# Patient Record
Sex: Female | Born: 1945 | Race: White | Hispanic: No | Smoking: Former smoker
Health system: Southern US, Community
[De-identification: ages and names within clinical notes are randomized; demographics above are authoritative.]

## PROBLEM LIST (undated history)

## (undated) DIAGNOSIS — F32A Depression, unspecified: Secondary | ICD-10-CM

## (undated) DIAGNOSIS — C801 Malignant (primary) neoplasm, unspecified: Secondary | ICD-10-CM

## (undated) DIAGNOSIS — E119 Type 2 diabetes mellitus without complications: Secondary | ICD-10-CM

## (undated) DIAGNOSIS — Z9289 Personal history of other medical treatment: Secondary | ICD-10-CM

## (undated) DIAGNOSIS — K219 Gastro-esophageal reflux disease without esophagitis: Secondary | ICD-10-CM

## (undated) DIAGNOSIS — K589 Irritable bowel syndrome without diarrhea: Secondary | ICD-10-CM

## (undated) DIAGNOSIS — F329 Major depressive disorder, single episode, unspecified: Secondary | ICD-10-CM

## (undated) DIAGNOSIS — C73 Malignant neoplasm of thyroid gland: Secondary | ICD-10-CM

## (undated) HISTORY — DX: Personal history of other medical treatment: Z92.89

## (undated) HISTORY — DX: Malignant (primary) neoplasm, unspecified: C80.1

## (undated) HISTORY — DX: Major depressive disorder, single episode, unspecified: F32.9

## (undated) HISTORY — DX: Depression, unspecified: F32.A

## (undated) HISTORY — DX: Type 2 diabetes mellitus without complications: E11.9

## (undated) HISTORY — PX: THYROIDECTOMY: SHX17

## (undated) HISTORY — PX: APPENDECTOMY: SHX54

## (undated) HISTORY — PX: CAROTID ENDARTERECTOMY: SUR193

## (undated) HISTORY — DX: Irritable bowel syndrome, unspecified: K58.9

## (undated) HISTORY — DX: Gastro-esophageal reflux disease without esophagitis: K21.9

## (undated) HISTORY — PX: CAROTID STENT: SHX1301

## (undated) HISTORY — DX: Irritable bowel syndrome without diarrhea: K58.9

## (undated) HISTORY — PX: TOTAL ABDOMINAL HYSTERECTOMY W/ BILATERAL SALPINGOOPHORECTOMY: SHX83

## (undated) HISTORY — PX: RENAL ARTERY STENT: SHX2321

## (undated) HISTORY — PX: TONSILLECTOMY AND ADENOIDECTOMY: SUR1326

## (undated) HISTORY — PX: BLADDER SUSPENSION: SHX72

---

## 2008-12-19 HISTORY — PX: ESOPHAGOGASTRODUODENOSCOPY: SHX1529

## 2011-09-16 DIAGNOSIS — I6529 Occlusion and stenosis of unspecified carotid artery: Secondary | ICD-10-CM | POA: Insufficient documentation

## 2013-08-09 DIAGNOSIS — R1011 Right upper quadrant pain: Secondary | ICD-10-CM | POA: Diagnosis not present

## 2013-08-09 DIAGNOSIS — E669 Obesity, unspecified: Secondary | ICD-10-CM | POA: Diagnosis not present

## 2013-08-09 DIAGNOSIS — M546 Pain in thoracic spine: Secondary | ICD-10-CM | POA: Diagnosis not present

## 2013-08-23 DIAGNOSIS — I739 Peripheral vascular disease, unspecified: Secondary | ICD-10-CM | POA: Diagnosis not present

## 2013-08-23 DIAGNOSIS — J983 Compensatory emphysema: Secondary | ICD-10-CM | POA: Diagnosis not present

## 2013-08-23 DIAGNOSIS — Z7901 Long term (current) use of anticoagulants: Secondary | ICD-10-CM | POA: Diagnosis not present

## 2013-08-23 DIAGNOSIS — I4891 Unspecified atrial fibrillation: Secondary | ICD-10-CM | POA: Diagnosis not present

## 2013-08-23 DIAGNOSIS — E119 Type 2 diabetes mellitus without complications: Secondary | ICD-10-CM | POA: Diagnosis not present

## 2013-08-23 DIAGNOSIS — R1011 Right upper quadrant pain: Secondary | ICD-10-CM | POA: Diagnosis not present

## 2013-09-05 DIAGNOSIS — R109 Unspecified abdominal pain: Secondary | ICD-10-CM | POA: Diagnosis not present

## 2013-09-05 DIAGNOSIS — N289 Disorder of kidney and ureter, unspecified: Secondary | ICD-10-CM | POA: Diagnosis not present

## 2013-11-30 DIAGNOSIS — I6529 Occlusion and stenosis of unspecified carotid artery: Secondary | ICD-10-CM | POA: Diagnosis not present

## 2013-11-30 DIAGNOSIS — E119 Type 2 diabetes mellitus without complications: Secondary | ICD-10-CM | POA: Diagnosis not present

## 2013-11-30 DIAGNOSIS — J983 Compensatory emphysema: Secondary | ICD-10-CM | POA: Diagnosis not present

## 2013-11-30 DIAGNOSIS — E785 Hyperlipidemia, unspecified: Secondary | ICD-10-CM | POA: Diagnosis not present

## 2013-11-30 DIAGNOSIS — I10 Essential (primary) hypertension: Secondary | ICD-10-CM | POA: Diagnosis not present

## 2013-11-30 DIAGNOSIS — Z7901 Long term (current) use of anticoagulants: Secondary | ICD-10-CM | POA: Diagnosis not present

## 2013-11-30 DIAGNOSIS — I4891 Unspecified atrial fibrillation: Secondary | ICD-10-CM | POA: Diagnosis not present

## 2013-11-30 DIAGNOSIS — I739 Peripheral vascular disease, unspecified: Secondary | ICD-10-CM | POA: Diagnosis not present

## 2013-12-19 DIAGNOSIS — L219 Seborrheic dermatitis, unspecified: Secondary | ICD-10-CM | POA: Diagnosis not present

## 2013-12-19 DIAGNOSIS — L280 Lichen simplex chronicus: Secondary | ICD-10-CM | POA: Diagnosis not present

## 2013-12-19 DIAGNOSIS — L820 Inflamed seborrheic keratosis: Secondary | ICD-10-CM | POA: Diagnosis not present

## 2013-12-23 DIAGNOSIS — F17200 Nicotine dependence, unspecified, uncomplicated: Secondary | ICD-10-CM | POA: Diagnosis not present

## 2013-12-23 DIAGNOSIS — M4120 Other idiopathic scoliosis, site unspecified: Secondary | ICD-10-CM | POA: Diagnosis not present

## 2013-12-23 DIAGNOSIS — E7800 Pure hypercholesterolemia, unspecified: Secondary | ICD-10-CM | POA: Diagnosis not present

## 2013-12-23 DIAGNOSIS — E119 Type 2 diabetes mellitus without complications: Secondary | ICD-10-CM | POA: Diagnosis not present

## 2013-12-23 DIAGNOSIS — I4891 Unspecified atrial fibrillation: Secondary | ICD-10-CM | POA: Diagnosis not present

## 2013-12-23 DIAGNOSIS — I10 Essential (primary) hypertension: Secondary | ICD-10-CM | POA: Diagnosis not present

## 2013-12-23 DIAGNOSIS — M47814 Spondylosis without myelopathy or radiculopathy, thoracic region: Secondary | ICD-10-CM | POA: Diagnosis not present

## 2013-12-23 DIAGNOSIS — I509 Heart failure, unspecified: Secondary | ICD-10-CM | POA: Diagnosis not present

## 2013-12-23 DIAGNOSIS — Z7901 Long term (current) use of anticoagulants: Secondary | ICD-10-CM | POA: Diagnosis not present

## 2013-12-23 DIAGNOSIS — K219 Gastro-esophageal reflux disease without esophagitis: Secondary | ICD-10-CM | POA: Diagnosis not present

## 2013-12-23 DIAGNOSIS — J449 Chronic obstructive pulmonary disease, unspecified: Secondary | ICD-10-CM | POA: Diagnosis not present

## 2013-12-23 DIAGNOSIS — M546 Pain in thoracic spine: Secondary | ICD-10-CM | POA: Diagnosis not present

## 2013-12-23 DIAGNOSIS — R0602 Shortness of breath: Secondary | ICD-10-CM | POA: Diagnosis not present

## 2013-12-23 DIAGNOSIS — Z79899 Other long term (current) drug therapy: Secondary | ICD-10-CM | POA: Diagnosis not present

## 2014-01-05 DIAGNOSIS — R002 Palpitations: Secondary | ICD-10-CM | POA: Diagnosis not present

## 2014-01-05 DIAGNOSIS — F17200 Nicotine dependence, unspecified, uncomplicated: Secondary | ICD-10-CM | POA: Diagnosis not present

## 2014-01-05 DIAGNOSIS — Z794 Long term (current) use of insulin: Secondary | ICD-10-CM | POA: Diagnosis not present

## 2014-01-05 DIAGNOSIS — K219 Gastro-esophageal reflux disease without esophagitis: Secondary | ICD-10-CM | POA: Diagnosis not present

## 2014-01-05 DIAGNOSIS — I4891 Unspecified atrial fibrillation: Secondary | ICD-10-CM | POA: Diagnosis not present

## 2014-01-05 DIAGNOSIS — I10 Essential (primary) hypertension: Secondary | ICD-10-CM | POA: Diagnosis not present

## 2014-01-05 DIAGNOSIS — Z7901 Long term (current) use of anticoagulants: Secondary | ICD-10-CM | POA: Diagnosis not present

## 2014-01-05 DIAGNOSIS — E7800 Pure hypercholesterolemia, unspecified: Secondary | ICD-10-CM | POA: Diagnosis not present

## 2014-01-05 DIAGNOSIS — Z79899 Other long term (current) drug therapy: Secondary | ICD-10-CM | POA: Diagnosis not present

## 2014-01-05 DIAGNOSIS — J449 Chronic obstructive pulmonary disease, unspecified: Secondary | ICD-10-CM | POA: Diagnosis not present

## 2014-01-05 DIAGNOSIS — E119 Type 2 diabetes mellitus without complications: Secondary | ICD-10-CM | POA: Diagnosis not present

## 2014-01-05 DIAGNOSIS — R918 Other nonspecific abnormal finding of lung field: Secondary | ICD-10-CM | POA: Diagnosis not present

## 2014-01-05 DIAGNOSIS — I509 Heart failure, unspecified: Secondary | ICD-10-CM | POA: Diagnosis not present

## 2014-01-05 DIAGNOSIS — R079 Chest pain, unspecified: Secondary | ICD-10-CM | POA: Diagnosis not present

## 2014-03-17 DIAGNOSIS — K620 Anal polyp: Secondary | ICD-10-CM | POA: Diagnosis not present

## 2014-03-17 DIAGNOSIS — I6529 Occlusion and stenosis of unspecified carotid artery: Secondary | ICD-10-CM | POA: Diagnosis not present

## 2014-03-17 DIAGNOSIS — I10 Essential (primary) hypertension: Secondary | ICD-10-CM | POA: Diagnosis not present

## 2014-03-17 DIAGNOSIS — I4891 Unspecified atrial fibrillation: Secondary | ICD-10-CM | POA: Diagnosis not present

## 2014-03-17 DIAGNOSIS — R1310 Dysphagia, unspecified: Secondary | ICD-10-CM | POA: Diagnosis not present

## 2014-03-17 DIAGNOSIS — Z794 Long term (current) use of insulin: Secondary | ICD-10-CM | POA: Diagnosis not present

## 2014-03-17 DIAGNOSIS — D126 Benign neoplasm of colon, unspecified: Secondary | ICD-10-CM | POA: Diagnosis not present

## 2014-03-17 DIAGNOSIS — I739 Peripheral vascular disease, unspecified: Secondary | ICD-10-CM | POA: Diagnosis not present

## 2014-03-17 DIAGNOSIS — Z886 Allergy status to analgesic agent status: Secondary | ICD-10-CM | POA: Diagnosis not present

## 2014-03-17 DIAGNOSIS — E785 Hyperlipidemia, unspecified: Secondary | ICD-10-CM | POA: Diagnosis not present

## 2014-03-17 DIAGNOSIS — Z79899 Other long term (current) drug therapy: Secondary | ICD-10-CM | POA: Diagnosis not present

## 2014-03-17 DIAGNOSIS — Z7901 Long term (current) use of anticoagulants: Secondary | ICD-10-CM | POA: Diagnosis not present

## 2014-03-17 DIAGNOSIS — K648 Other hemorrhoids: Secondary | ICD-10-CM | POA: Diagnosis not present

## 2014-03-17 DIAGNOSIS — R1011 Right upper quadrant pain: Secondary | ICD-10-CM | POA: Diagnosis not present

## 2014-03-17 DIAGNOSIS — Z86010 Personal history of colonic polyps: Secondary | ICD-10-CM | POA: Diagnosis not present

## 2014-03-17 DIAGNOSIS — Z87891 Personal history of nicotine dependence: Secondary | ICD-10-CM | POA: Diagnosis not present

## 2014-03-17 DIAGNOSIS — Z91040 Latex allergy status: Secondary | ICD-10-CM | POA: Diagnosis not present

## 2014-03-17 DIAGNOSIS — E7800 Pure hypercholesterolemia, unspecified: Secondary | ICD-10-CM | POA: Diagnosis not present

## 2014-03-17 DIAGNOSIS — K219 Gastro-esophageal reflux disease without esophagitis: Secondary | ICD-10-CM | POA: Diagnosis not present

## 2014-03-17 DIAGNOSIS — K209 Esophagitis, unspecified without bleeding: Secondary | ICD-10-CM | POA: Diagnosis not present

## 2014-03-17 DIAGNOSIS — Z6836 Body mass index (BMI) 36.0-36.9, adult: Secondary | ICD-10-CM | POA: Diagnosis not present

## 2014-03-17 DIAGNOSIS — K621 Rectal polyp: Secondary | ICD-10-CM | POA: Diagnosis not present

## 2014-03-17 DIAGNOSIS — R1031 Right lower quadrant pain: Secondary | ICD-10-CM | POA: Diagnosis not present

## 2014-03-17 DIAGNOSIS — E669 Obesity, unspecified: Secondary | ICD-10-CM | POA: Diagnosis not present

## 2014-03-17 DIAGNOSIS — I2510 Atherosclerotic heart disease of native coronary artery without angina pectoris: Secondary | ICD-10-CM | POA: Diagnosis not present

## 2014-03-17 DIAGNOSIS — K644 Residual hemorrhoidal skin tags: Secondary | ICD-10-CM | POA: Diagnosis not present

## 2014-05-08 DIAGNOSIS — Z7901 Long term (current) use of anticoagulants: Secondary | ICD-10-CM | POA: Diagnosis not present

## 2014-05-08 DIAGNOSIS — E119 Type 2 diabetes mellitus without complications: Secondary | ICD-10-CM | POA: Diagnosis not present

## 2014-05-08 DIAGNOSIS — F17200 Nicotine dependence, unspecified, uncomplicated: Secondary | ICD-10-CM | POA: Diagnosis not present

## 2014-05-08 DIAGNOSIS — E7800 Pure hypercholesterolemia, unspecified: Secondary | ICD-10-CM | POA: Diagnosis not present

## 2014-05-08 DIAGNOSIS — E872 Acidosis, unspecified: Secondary | ICD-10-CM | POA: Diagnosis not present

## 2014-05-08 DIAGNOSIS — I10 Essential (primary) hypertension: Secondary | ICD-10-CM | POA: Diagnosis not present

## 2014-05-08 DIAGNOSIS — I509 Heart failure, unspecified: Secondary | ICD-10-CM | POA: Diagnosis not present

## 2014-05-08 DIAGNOSIS — J449 Chronic obstructive pulmonary disease, unspecified: Secondary | ICD-10-CM | POA: Diagnosis not present

## 2014-05-08 DIAGNOSIS — J9600 Acute respiratory failure, unspecified whether with hypoxia or hypercapnia: Secondary | ICD-10-CM | POA: Diagnosis not present

## 2014-05-08 DIAGNOSIS — R5381 Other malaise: Secondary | ICD-10-CM | POA: Diagnosis not present

## 2014-05-08 DIAGNOSIS — N390 Urinary tract infection, site not specified: Secondary | ICD-10-CM | POA: Diagnosis not present

## 2014-05-08 DIAGNOSIS — R4182 Altered mental status, unspecified: Secondary | ICD-10-CM | POA: Diagnosis not present

## 2014-05-08 DIAGNOSIS — I4891 Unspecified atrial fibrillation: Secondary | ICD-10-CM | POA: Diagnosis not present

## 2014-05-08 DIAGNOSIS — Z79899 Other long term (current) drug therapy: Secondary | ICD-10-CM | POA: Diagnosis not present

## 2014-05-08 DIAGNOSIS — R404 Transient alteration of awareness: Secondary | ICD-10-CM | POA: Diagnosis not present

## 2014-05-08 DIAGNOSIS — R0902 Hypoxemia: Secondary | ICD-10-CM | POA: Diagnosis not present

## 2014-05-08 DIAGNOSIS — K7200 Acute and subacute hepatic failure without coma: Secondary | ICD-10-CM | POA: Diagnosis not present

## 2014-05-08 DIAGNOSIS — N179 Acute kidney failure, unspecified: Secondary | ICD-10-CM | POA: Diagnosis not present

## 2014-05-08 DIAGNOSIS — R5383 Other fatigue: Secondary | ICD-10-CM | POA: Diagnosis not present

## 2014-05-08 DIAGNOSIS — K219 Gastro-esophageal reflux disease without esophagitis: Secondary | ICD-10-CM | POA: Diagnosis not present

## 2014-05-09 DIAGNOSIS — Z6833 Body mass index (BMI) 33.0-33.9, adult: Secondary | ICD-10-CM | POA: Diagnosis not present

## 2014-05-09 DIAGNOSIS — N390 Urinary tract infection, site not specified: Secondary | ICD-10-CM | POA: Diagnosis present

## 2014-05-09 DIAGNOSIS — B3731 Acute candidiasis of vulva and vagina: Secondary | ICD-10-CM | POA: Diagnosis present

## 2014-05-09 DIAGNOSIS — E890 Postprocedural hypothyroidism: Secondary | ICD-10-CM | POA: Diagnosis present

## 2014-05-09 DIAGNOSIS — K589 Irritable bowel syndrome without diarrhea: Secondary | ICD-10-CM | POA: Diagnosis present

## 2014-05-09 DIAGNOSIS — E119 Type 2 diabetes mellitus without complications: Secondary | ICD-10-CM | POA: Diagnosis present

## 2014-05-09 DIAGNOSIS — I4891 Unspecified atrial fibrillation: Secondary | ICD-10-CM | POA: Diagnosis present

## 2014-05-09 DIAGNOSIS — E785 Hyperlipidemia, unspecified: Secondary | ICD-10-CM | POA: Diagnosis present

## 2014-05-09 DIAGNOSIS — E872 Acidosis, unspecified: Secondary | ICD-10-CM | POA: Diagnosis present

## 2014-05-09 DIAGNOSIS — I10 Essential (primary) hypertension: Secondary | ICD-10-CM | POA: Diagnosis present

## 2014-05-09 DIAGNOSIS — F17200 Nicotine dependence, unspecified, uncomplicated: Secondary | ICD-10-CM | POA: Diagnosis present

## 2014-05-09 DIAGNOSIS — K7200 Acute and subacute hepatic failure without coma: Secondary | ICD-10-CM | POA: Diagnosis present

## 2014-05-09 DIAGNOSIS — J9600 Acute respiratory failure, unspecified whether with hypoxia or hypercapnia: Secondary | ICD-10-CM | POA: Diagnosis present

## 2014-05-09 DIAGNOSIS — R404 Transient alteration of awareness: Secondary | ICD-10-CM | POA: Diagnosis not present

## 2014-05-09 DIAGNOSIS — B373 Candidiasis of vulva and vagina: Secondary | ICD-10-CM | POA: Diagnosis present

## 2014-05-09 DIAGNOSIS — K219 Gastro-esophageal reflux disease without esophagitis: Secondary | ICD-10-CM | POA: Diagnosis present

## 2014-05-09 DIAGNOSIS — R6520 Severe sepsis without septic shock: Secondary | ICD-10-CM | POA: Diagnosis present

## 2014-05-09 DIAGNOSIS — R791 Abnormal coagulation profile: Secondary | ICD-10-CM | POA: Diagnosis present

## 2014-05-09 DIAGNOSIS — A0472 Enterocolitis due to Clostridium difficile, not specified as recurrent: Secondary | ICD-10-CM | POA: Diagnosis not present

## 2014-05-09 DIAGNOSIS — G8929 Other chronic pain: Secondary | ICD-10-CM | POA: Diagnosis present

## 2014-05-09 DIAGNOSIS — I509 Heart failure, unspecified: Secondary | ICD-10-CM | POA: Diagnosis present

## 2014-05-09 DIAGNOSIS — D509 Iron deficiency anemia, unspecified: Secondary | ICD-10-CM | POA: Diagnosis present

## 2014-05-09 DIAGNOSIS — A419 Sepsis, unspecified organism: Secondary | ICD-10-CM | POA: Diagnosis present

## 2014-05-09 DIAGNOSIS — I214 Non-ST elevation (NSTEMI) myocardial infarction: Secondary | ICD-10-CM | POA: Diagnosis present

## 2014-05-09 DIAGNOSIS — N179 Acute kidney failure, unspecified: Secondary | ICD-10-CM | POA: Diagnosis present

## 2014-05-09 DIAGNOSIS — R0902 Hypoxemia: Secondary | ICD-10-CM | POA: Diagnosis not present

## 2014-05-09 DIAGNOSIS — J441 Chronic obstructive pulmonary disease with (acute) exacerbation: Secondary | ICD-10-CM | POA: Diagnosis present

## 2014-05-11 DIAGNOSIS — J9691 Respiratory failure, unspecified with hypoxia: Secondary | ICD-10-CM | POA: Insufficient documentation

## 2014-05-11 DIAGNOSIS — E890 Postprocedural hypothyroidism: Secondary | ICD-10-CM | POA: Insufficient documentation

## 2014-05-11 DIAGNOSIS — E119 Type 2 diabetes mellitus without complications: Secondary | ICD-10-CM | POA: Insufficient documentation

## 2014-05-11 DIAGNOSIS — I214 Non-ST elevation (NSTEMI) myocardial infarction: Secondary | ICD-10-CM | POA: Insufficient documentation

## 2014-05-11 DIAGNOSIS — Z9009 Acquired absence of other part of head and neck: Secondary | ICD-10-CM | POA: Insufficient documentation

## 2014-05-11 DIAGNOSIS — N179 Acute kidney failure, unspecified: Secondary | ICD-10-CM | POA: Insufficient documentation

## 2014-05-11 DIAGNOSIS — K759 Inflammatory liver disease, unspecified: Secondary | ICD-10-CM | POA: Insufficient documentation

## 2014-05-11 DIAGNOSIS — K7200 Acute and subacute hepatic failure without coma: Secondary | ICD-10-CM | POA: Insufficient documentation

## 2014-05-11 DIAGNOSIS — J9692 Respiratory failure, unspecified with hypercapnia: Secondary | ICD-10-CM

## 2014-05-11 DIAGNOSIS — R001 Bradycardia, unspecified: Secondary | ICD-10-CM | POA: Insufficient documentation

## 2014-05-15 DIAGNOSIS — A0472 Enterocolitis due to Clostridium difficile, not specified as recurrent: Secondary | ICD-10-CM | POA: Insufficient documentation

## 2014-05-15 DIAGNOSIS — F17200 Nicotine dependence, unspecified, uncomplicated: Secondary | ICD-10-CM | POA: Insufficient documentation

## 2014-05-16 DIAGNOSIS — J90 Pleural effusion, not elsewhere classified: Secondary | ICD-10-CM | POA: Insufficient documentation

## 2014-05-18 DIAGNOSIS — A419 Sepsis, unspecified organism: Secondary | ICD-10-CM | POA: Diagnosis not present

## 2014-05-18 DIAGNOSIS — I214 Non-ST elevation (NSTEMI) myocardial infarction: Secondary | ICD-10-CM | POA: Diagnosis not present

## 2014-05-18 DIAGNOSIS — A047 Enterocolitis due to Clostridium difficile: Secondary | ICD-10-CM | POA: Diagnosis not present

## 2014-05-18 DIAGNOSIS — N390 Urinary tract infection, site not specified: Secondary | ICD-10-CM | POA: Diagnosis not present

## 2014-06-01 DIAGNOSIS — L219 Seborrheic dermatitis, unspecified: Secondary | ICD-10-CM | POA: Diagnosis not present

## 2014-06-01 DIAGNOSIS — N183 Chronic kidney disease, stage 3 (moderate): Secondary | ICD-10-CM | POA: Diagnosis not present

## 2014-06-01 DIAGNOSIS — E1165 Type 2 diabetes mellitus with hyperglycemia: Secondary | ICD-10-CM | POA: Diagnosis not present

## 2014-06-01 DIAGNOSIS — E1122 Type 2 diabetes mellitus with diabetic chronic kidney disease: Secondary | ICD-10-CM | POA: Diagnosis not present

## 2014-06-08 DIAGNOSIS — E785 Hyperlipidemia, unspecified: Secondary | ICD-10-CM | POA: Diagnosis not present

## 2014-06-08 DIAGNOSIS — I4891 Unspecified atrial fibrillation: Secondary | ICD-10-CM | POA: Diagnosis not present

## 2014-06-08 DIAGNOSIS — I739 Peripheral vascular disease, unspecified: Secondary | ICD-10-CM | POA: Diagnosis not present

## 2014-06-08 DIAGNOSIS — R42 Dizziness and giddiness: Secondary | ICD-10-CM | POA: Diagnosis not present

## 2014-06-08 DIAGNOSIS — I10 Essential (primary) hypertension: Secondary | ICD-10-CM | POA: Diagnosis not present

## 2014-06-15 DIAGNOSIS — D649 Anemia, unspecified: Secondary | ICD-10-CM | POA: Diagnosis not present

## 2014-06-21 DIAGNOSIS — I2510 Atherosclerotic heart disease of native coronary artery without angina pectoris: Secondary | ICD-10-CM | POA: Diagnosis not present

## 2014-06-21 DIAGNOSIS — I10 Essential (primary) hypertension: Secondary | ICD-10-CM | POA: Diagnosis not present

## 2014-06-21 DIAGNOSIS — E785 Hyperlipidemia, unspecified: Secondary | ICD-10-CM | POA: Diagnosis not present

## 2014-06-29 DIAGNOSIS — N3941 Urge incontinence: Secondary | ICD-10-CM | POA: Diagnosis not present

## 2014-06-29 DIAGNOSIS — E1165 Type 2 diabetes mellitus with hyperglycemia: Secondary | ICD-10-CM | POA: Diagnosis not present

## 2014-06-29 DIAGNOSIS — N183 Chronic kidney disease, stage 3 (moderate): Secondary | ICD-10-CM | POA: Diagnosis not present

## 2014-06-29 DIAGNOSIS — E1122 Type 2 diabetes mellitus with diabetic chronic kidney disease: Secondary | ICD-10-CM | POA: Diagnosis not present

## 2014-07-04 DIAGNOSIS — I4891 Unspecified atrial fibrillation: Secondary | ICD-10-CM | POA: Diagnosis not present

## 2014-07-04 DIAGNOSIS — Z7901 Long term (current) use of anticoagulants: Secondary | ICD-10-CM | POA: Diagnosis not present

## 2014-07-04 DIAGNOSIS — E780 Pure hypercholesterolemia: Secondary | ICD-10-CM | POA: Diagnosis not present

## 2014-07-04 DIAGNOSIS — E039 Hypothyroidism, unspecified: Secondary | ICD-10-CM | POA: Diagnosis not present

## 2014-07-04 DIAGNOSIS — E119 Type 2 diabetes mellitus without complications: Secondary | ICD-10-CM | POA: Diagnosis not present

## 2014-07-04 DIAGNOSIS — D509 Iron deficiency anemia, unspecified: Secondary | ICD-10-CM | POA: Diagnosis not present

## 2014-07-04 DIAGNOSIS — Z79899 Other long term (current) drug therapy: Secondary | ICD-10-CM | POA: Diagnosis not present

## 2014-07-06 DIAGNOSIS — E119 Type 2 diabetes mellitus without complications: Secondary | ICD-10-CM | POA: Diagnosis not present

## 2014-07-06 DIAGNOSIS — E785 Hyperlipidemia, unspecified: Secondary | ICD-10-CM | POA: Diagnosis not present

## 2014-07-06 DIAGNOSIS — I4891 Unspecified atrial fibrillation: Secondary | ICD-10-CM | POA: Diagnosis not present

## 2014-07-06 DIAGNOSIS — I739 Peripheral vascular disease, unspecified: Secondary | ICD-10-CM | POA: Diagnosis not present

## 2014-07-07 DIAGNOSIS — D509 Iron deficiency anemia, unspecified: Secondary | ICD-10-CM | POA: Diagnosis not present

## 2014-07-10 DIAGNOSIS — I4891 Unspecified atrial fibrillation: Secondary | ICD-10-CM | POA: Diagnosis not present

## 2014-07-10 DIAGNOSIS — Z7901 Long term (current) use of anticoagulants: Secondary | ICD-10-CM | POA: Diagnosis not present

## 2014-07-11 DIAGNOSIS — D509 Iron deficiency anemia, unspecified: Secondary | ICD-10-CM | POA: Diagnosis not present

## 2014-07-25 DIAGNOSIS — R012 Other cardiac sounds: Secondary | ICD-10-CM | POA: Diagnosis not present

## 2014-07-25 DIAGNOSIS — I2510 Atherosclerotic heart disease of native coronary artery without angina pectoris: Secondary | ICD-10-CM | POA: Diagnosis not present

## 2014-07-25 DIAGNOSIS — I10 Essential (primary) hypertension: Secondary | ICD-10-CM | POA: Diagnosis not present

## 2014-08-30 DIAGNOSIS — R351 Nocturia: Secondary | ICD-10-CM | POA: Diagnosis not present

## 2014-08-30 DIAGNOSIS — G5601 Carpal tunnel syndrome, right upper limb: Secondary | ICD-10-CM | POA: Diagnosis not present

## 2014-08-30 DIAGNOSIS — E1122 Type 2 diabetes mellitus with diabetic chronic kidney disease: Secondary | ICD-10-CM | POA: Diagnosis not present

## 2014-08-30 DIAGNOSIS — E1165 Type 2 diabetes mellitus with hyperglycemia: Secondary | ICD-10-CM | POA: Diagnosis not present

## 2014-08-30 DIAGNOSIS — G5602 Carpal tunnel syndrome, left upper limb: Secondary | ICD-10-CM | POA: Diagnosis not present

## 2014-08-30 DIAGNOSIS — D179 Benign lipomatous neoplasm, unspecified: Secondary | ICD-10-CM | POA: Diagnosis not present

## 2014-08-30 DIAGNOSIS — R350 Frequency of micturition: Secondary | ICD-10-CM | POA: Diagnosis not present

## 2014-09-11 DIAGNOSIS — D509 Iron deficiency anemia, unspecified: Secondary | ICD-10-CM | POA: Diagnosis not present

## 2014-09-13 DIAGNOSIS — D179 Benign lipomatous neoplasm, unspecified: Secondary | ICD-10-CM | POA: Diagnosis not present

## 2014-09-13 DIAGNOSIS — D509 Iron deficiency anemia, unspecified: Secondary | ICD-10-CM | POA: Diagnosis not present

## 2014-09-13 DIAGNOSIS — R350 Frequency of micturition: Secondary | ICD-10-CM | POA: Diagnosis not present

## 2014-09-20 DIAGNOSIS — D509 Iron deficiency anemia, unspecified: Secondary | ICD-10-CM | POA: Diagnosis not present

## 2014-09-27 DIAGNOSIS — E1165 Type 2 diabetes mellitus with hyperglycemia: Secondary | ICD-10-CM | POA: Diagnosis not present

## 2014-09-27 DIAGNOSIS — K5900 Constipation, unspecified: Secondary | ICD-10-CM | POA: Diagnosis not present

## 2014-09-27 DIAGNOSIS — M5431 Sciatica, right side: Secondary | ICD-10-CM | POA: Diagnosis not present

## 2014-09-27 DIAGNOSIS — E1122 Type 2 diabetes mellitus with diabetic chronic kidney disease: Secondary | ICD-10-CM | POA: Diagnosis not present

## 2014-09-27 DIAGNOSIS — N183 Chronic kidney disease, stage 3 (moderate): Secondary | ICD-10-CM | POA: Diagnosis not present

## 2014-09-27 DIAGNOSIS — D509 Iron deficiency anemia, unspecified: Secondary | ICD-10-CM | POA: Diagnosis not present

## 2014-10-03 DIAGNOSIS — K835 Biliary cyst: Secondary | ICD-10-CM | POA: Diagnosis not present

## 2014-10-03 DIAGNOSIS — K7689 Other specified diseases of liver: Secondary | ICD-10-CM | POA: Diagnosis not present

## 2014-10-03 DIAGNOSIS — D1771 Benign lipomatous neoplasm of kidney: Secondary | ICD-10-CM | POA: Diagnosis not present

## 2014-10-03 DIAGNOSIS — D1803 Hemangioma of intra-abdominal structures: Secondary | ICD-10-CM | POA: Diagnosis not present

## 2014-10-03 DIAGNOSIS — K760 Fatty (change of) liver, not elsewhere classified: Secondary | ICD-10-CM | POA: Diagnosis not present

## 2014-10-03 DIAGNOSIS — R109 Unspecified abdominal pain: Secondary | ICD-10-CM | POA: Diagnosis not present

## 2014-10-11 DIAGNOSIS — E119 Type 2 diabetes mellitus without complications: Secondary | ICD-10-CM | POA: Diagnosis not present

## 2014-10-11 DIAGNOSIS — I739 Peripheral vascular disease, unspecified: Secondary | ICD-10-CM | POA: Diagnosis not present

## 2014-10-11 DIAGNOSIS — I10 Essential (primary) hypertension: Secondary | ICD-10-CM | POA: Diagnosis not present

## 2014-11-24 DIAGNOSIS — D509 Iron deficiency anemia, unspecified: Secondary | ICD-10-CM | POA: Diagnosis not present

## 2014-11-27 DIAGNOSIS — D509 Iron deficiency anemia, unspecified: Secondary | ICD-10-CM | POA: Diagnosis not present

## 2014-11-28 DIAGNOSIS — E1165 Type 2 diabetes mellitus with hyperglycemia: Secondary | ICD-10-CM | POA: Diagnosis not present

## 2014-12-05 DIAGNOSIS — E1129 Type 2 diabetes mellitus with other diabetic kidney complication: Secondary | ICD-10-CM | POA: Diagnosis not present

## 2014-12-05 DIAGNOSIS — Z720 Tobacco use: Secondary | ICD-10-CM | POA: Diagnosis not present

## 2014-12-05 DIAGNOSIS — E1165 Type 2 diabetes mellitus with hyperglycemia: Secondary | ICD-10-CM | POA: Diagnosis not present

## 2014-12-05 DIAGNOSIS — Z0000 Encounter for general adult medical examination without abnormal findings: Secondary | ICD-10-CM | POA: Diagnosis not present

## 2014-12-05 DIAGNOSIS — G629 Polyneuropathy, unspecified: Secondary | ICD-10-CM | POA: Diagnosis not present

## 2014-12-14 DIAGNOSIS — Z136 Encounter for screening for cardiovascular disorders: Secondary | ICD-10-CM | POA: Diagnosis not present

## 2014-12-27 DIAGNOSIS — I739 Peripheral vascular disease, unspecified: Secondary | ICD-10-CM | POA: Diagnosis not present

## 2014-12-27 DIAGNOSIS — M79606 Pain in leg, unspecified: Secondary | ICD-10-CM | POA: Diagnosis not present

## 2014-12-27 DIAGNOSIS — F17210 Nicotine dependence, cigarettes, uncomplicated: Secondary | ICD-10-CM | POA: Diagnosis not present

## 2014-12-27 DIAGNOSIS — I70223 Atherosclerosis of native arteries of extremities with rest pain, bilateral legs: Secondary | ICD-10-CM | POA: Diagnosis not present

## 2014-12-27 DIAGNOSIS — Z885 Allergy status to narcotic agent status: Secondary | ICD-10-CM | POA: Diagnosis not present

## 2014-12-27 DIAGNOSIS — E119 Type 2 diabetes mellitus without complications: Secondary | ICD-10-CM | POA: Diagnosis not present

## 2014-12-27 DIAGNOSIS — Z886 Allergy status to analgesic agent status: Secondary | ICD-10-CM | POA: Diagnosis not present

## 2014-12-27 DIAGNOSIS — Z91040 Latex allergy status: Secondary | ICD-10-CM | POA: Diagnosis not present

## 2014-12-27 DIAGNOSIS — G8929 Other chronic pain: Secondary | ICD-10-CM | POA: Diagnosis not present

## 2014-12-27 DIAGNOSIS — F17200 Nicotine dependence, unspecified, uncomplicated: Secondary | ICD-10-CM | POA: Diagnosis not present

## 2014-12-27 DIAGNOSIS — I10 Essential (primary) hypertension: Secondary | ICD-10-CM | POA: Diagnosis not present

## 2015-04-03 DIAGNOSIS — E785 Hyperlipidemia, unspecified: Secondary | ICD-10-CM | POA: Diagnosis not present

## 2015-04-03 DIAGNOSIS — Z794 Long term (current) use of insulin: Secondary | ICD-10-CM | POA: Diagnosis not present

## 2015-04-03 DIAGNOSIS — E1129 Type 2 diabetes mellitus with other diabetic kidney complication: Secondary | ICD-10-CM | POA: Diagnosis not present

## 2015-04-03 DIAGNOSIS — R809 Proteinuria, unspecified: Secondary | ICD-10-CM | POA: Diagnosis not present

## 2015-04-03 DIAGNOSIS — E1165 Type 2 diabetes mellitus with hyperglycemia: Secondary | ICD-10-CM | POA: Diagnosis not present

## 2015-06-04 DIAGNOSIS — D509 Iron deficiency anemia, unspecified: Secondary | ICD-10-CM | POA: Diagnosis not present

## 2015-06-08 DIAGNOSIS — D509 Iron deficiency anemia, unspecified: Secondary | ICD-10-CM | POA: Diagnosis not present

## 2015-06-08 DIAGNOSIS — Z862 Personal history of diseases of the blood and blood-forming organs and certain disorders involving the immune mechanism: Secondary | ICD-10-CM | POA: Diagnosis not present

## 2015-07-04 DIAGNOSIS — T383X1A Poisoning by insulin and oral hypoglycemic [antidiabetic] drugs, accidental (unintentional), initial encounter: Secondary | ICD-10-CM | POA: Diagnosis not present

## 2015-07-04 DIAGNOSIS — E119 Type 2 diabetes mellitus without complications: Secondary | ICD-10-CM | POA: Diagnosis not present

## 2015-07-04 DIAGNOSIS — T383X4A Poisoning by insulin and oral hypoglycemic [antidiabetic] drugs, undetermined, initial encounter: Secondary | ICD-10-CM | POA: Diagnosis not present

## 2015-07-24 DIAGNOSIS — L03032 Cellulitis of left toe: Secondary | ICD-10-CM | POA: Diagnosis not present

## 2015-07-24 DIAGNOSIS — G8929 Other chronic pain: Secondary | ICD-10-CM | POA: Diagnosis not present

## 2015-07-24 DIAGNOSIS — M25512 Pain in left shoulder: Secondary | ICD-10-CM | POA: Diagnosis not present

## 2015-08-06 DIAGNOSIS — E119 Type 2 diabetes mellitus without complications: Secondary | ICD-10-CM | POA: Diagnosis not present

## 2015-08-06 DIAGNOSIS — L600 Ingrowing nail: Secondary | ICD-10-CM | POA: Diagnosis not present

## 2015-08-08 DIAGNOSIS — M7542 Impingement syndrome of left shoulder: Secondary | ICD-10-CM | POA: Diagnosis not present

## 2015-08-08 DIAGNOSIS — M25512 Pain in left shoulder: Secondary | ICD-10-CM | POA: Diagnosis not present

## 2015-08-15 DIAGNOSIS — M25612 Stiffness of left shoulder, not elsewhere classified: Secondary | ICD-10-CM | POA: Diagnosis not present

## 2015-08-15 DIAGNOSIS — M25512 Pain in left shoulder: Secondary | ICD-10-CM | POA: Diagnosis not present

## 2015-08-17 DIAGNOSIS — M25512 Pain in left shoulder: Secondary | ICD-10-CM | POA: Diagnosis not present

## 2015-08-17 DIAGNOSIS — M25612 Stiffness of left shoulder, not elsewhere classified: Secondary | ICD-10-CM | POA: Diagnosis not present

## 2015-08-24 DIAGNOSIS — M25512 Pain in left shoulder: Secondary | ICD-10-CM | POA: Diagnosis not present

## 2015-08-24 DIAGNOSIS — M25612 Stiffness of left shoulder, not elsewhere classified: Secondary | ICD-10-CM | POA: Diagnosis not present

## 2015-09-03 DIAGNOSIS — M25612 Stiffness of left shoulder, not elsewhere classified: Secondary | ICD-10-CM | POA: Diagnosis not present

## 2015-09-03 DIAGNOSIS — M25512 Pain in left shoulder: Secondary | ICD-10-CM | POA: Diagnosis not present

## 2015-12-05 DIAGNOSIS — E890 Postprocedural hypothyroidism: Secondary | ICD-10-CM | POA: Diagnosis not present

## 2015-12-05 DIAGNOSIS — I480 Paroxysmal atrial fibrillation: Secondary | ICD-10-CM | POA: Diagnosis not present

## 2015-12-05 DIAGNOSIS — R001 Bradycardia, unspecified: Secondary | ICD-10-CM | POA: Diagnosis not present

## 2015-12-05 DIAGNOSIS — F17200 Nicotine dependence, unspecified, uncomplicated: Secondary | ICD-10-CM | POA: Diagnosis not present

## 2015-12-19 DIAGNOSIS — E782 Mixed hyperlipidemia: Secondary | ICD-10-CM | POA: Diagnosis not present

## 2015-12-19 DIAGNOSIS — F339 Major depressive disorder, recurrent, unspecified: Secondary | ICD-10-CM | POA: Diagnosis not present

## 2015-12-19 DIAGNOSIS — E890 Postprocedural hypothyroidism: Secondary | ICD-10-CM | POA: Diagnosis not present

## 2015-12-19 DIAGNOSIS — M545 Low back pain: Secondary | ICD-10-CM | POA: Diagnosis not present

## 2015-12-19 DIAGNOSIS — E119 Type 2 diabetes mellitus without complications: Secondary | ICD-10-CM | POA: Diagnosis not present

## 2015-12-19 DIAGNOSIS — I10 Essential (primary) hypertension: Secondary | ICD-10-CM | POA: Diagnosis not present

## 2015-12-19 DIAGNOSIS — E669 Obesity, unspecified: Secondary | ICD-10-CM | POA: Diagnosis not present

## 2015-12-19 DIAGNOSIS — I5022 Chronic systolic (congestive) heart failure: Secondary | ICD-10-CM | POA: Diagnosis not present

## 2015-12-19 DIAGNOSIS — K219 Gastro-esophageal reflux disease without esophagitis: Secondary | ICD-10-CM | POA: Diagnosis not present

## 2016-01-17 DIAGNOSIS — I10 Essential (primary) hypertension: Secondary | ICD-10-CM | POA: Diagnosis not present

## 2016-03-24 DIAGNOSIS — I2510 Atherosclerotic heart disease of native coronary artery without angina pectoris: Secondary | ICD-10-CM | POA: Insufficient documentation

## 2017-03-18 ENCOUNTER — Ambulatory Visit (INDEPENDENT_AMBULATORY_CARE_PROVIDER_SITE_OTHER): Payer: Medicare Other | Admitting: Cardiology

## 2017-03-18 ENCOUNTER — Encounter: Payer: Self-pay | Admitting: Cardiology

## 2017-03-18 VITALS — BP 110/66 | HR 52 | Resp 16 | Ht 64.0 in | Wt 195.2 lb

## 2017-03-18 DIAGNOSIS — I480 Paroxysmal atrial fibrillation: Secondary | ICD-10-CM

## 2017-03-18 DIAGNOSIS — Z789 Other specified health status: Secondary | ICD-10-CM

## 2017-03-18 DIAGNOSIS — I2510 Atherosclerotic heart disease of native coronary artery without angina pectoris: Secondary | ICD-10-CM

## 2017-03-18 DIAGNOSIS — I739 Peripheral vascular disease, unspecified: Secondary | ICD-10-CM

## 2017-03-18 DIAGNOSIS — J9612 Chronic respiratory failure with hypercapnia: Secondary | ICD-10-CM | POA: Diagnosis not present

## 2017-03-18 DIAGNOSIS — F17200 Nicotine dependence, unspecified, uncomplicated: Secondary | ICD-10-CM

## 2017-03-18 DIAGNOSIS — J9611 Chronic respiratory failure with hypoxia: Secondary | ICD-10-CM

## 2017-03-18 MED ORDER — XARELTO 15 MG PO TABS: 15.0000 mg | ORAL_TABLET | Freq: Every day | ORAL | 11 refills | Status: DC

## 2017-03-18 NOTE — Progress Notes (Signed)
Cardiology Office Note:    Date:  03/18/2017   ID:  Misty, Payne 25-Nov-1945, MRN [id number]  PCP:  Aldona Bar, MD  Cardiologist:  Jenne Campus, MD    Referring MD: No ref. provider found   Chief Complaint  Patient presents with  . Follow-up  I'm having pain in my leg  History of Present Illness:    Misty Payne is a 71 y.o. female  with coronary artery disease. Overall cardiac-wise appears to be doing fine. Denies having any chest pain, tightness, squeezing, pressure, burning in the chest. Exertional shortness of breath is still present not worse. Does complaining of having buttock pain as well as leg pain when she walks. She did have segmental pressures which was nondiagnostic done year ago however since that time symptoms quite significantly worsened. Denies having palpitation, keep taking anti-fibrillation.  Past Medical History:  Diagnosis Date  . Cancer ([place])   . Depression   . Diabetes mellitus without complication ([place])   . GERD (gastroesophageal reflux disease)   . History of blood transfusion   . IBS (irritable bowel syndrome)   . Thyroid disease     Past Surgical History:  Procedure Laterality Date  . APPENDECTOMY    . BLADDER SUSPENSION    . CAROTID ENDARTERECTOMY    . CAROTID STENT    . RENAL ARTERY STENT    . THYROIDECTOMY    . TONSILLECTOMY AND ADENOIDECTOMY    . TOTAL ABDOMINAL HYSTERECTOMY W/ BILATERAL SALPINGOOPHORECTOMY      Current Medications: Current Meds  Medication Sig  . DOCOSAHEXAENOIC ACID PO Take 2 g by mouth daily.  Marland Kitchen ezetimibe (ZETIA) 10 MG tablet Take 10 mg by mouth daily.  Marland Kitchen FLUoxetine (PROZAC) 20 MG capsule Take 20 mg by mouth daily.  . furosemide (LASIX) 20 MG tablet Take 20 mg by mouth daily.  Marland Kitchen glipiZIDE (GLUCOTROL) 5 MG tablet Take 10 mg by mouth 2 (two) times daily.  . insulin glargine (LANTUS) 100 UNIT/ML injection Inject 20 Units into the skin at bedtime.  . insulin lispro (HUMALOG) 100 UNIT/ML  injection Inject 42 Units into the skin 2 (two) times daily.   Marland Kitchen levothyroxine (SYNTHROID, LEVOTHROID) 100 MCG tablet Take 1 tablet by mouth daily.  Marland Kitchen lisinopril (PRINIVIL,ZESTRIL) 10 MG tablet Take 10 mg by mouth daily.  . pantoprazole (PROTONIX) 40 MG tablet Take 40 mg by mouth at bedtime.  . Probiotic Product (PROBIOTIC-10 PO) Take 1 tablet by mouth daily.  . sotalol (BETAPACE) 80 MG tablet Take 80 mg by mouth 2 (two) times daily.     Allergies:   Tetanus toxoids; Aspirin; Codeine; Dextromethorphan; Statins; Latex; Lidocaine; Morpholine salicylate; Naloxone hcl; Oxycodone-acetaminophen; Pentazocine; and Tape   Social History   Social History  . Marital status: Legally Separated    Spouse name: N/A  . Number of children: N/A  . Years of education: N/A   Social History Main Topics  . Smoking status: Current Every Day Smoker  . Smokeless tobacco: Never Used  . Alcohol use No  . Drug use: No  . Sexual activity: Not Asked   Other Topics Concern  . None   Social History Narrative  . None     Family History: The patient's family history includes Cancer in her father and mother; Heart disease in her father and mother. ROS:   Please see the history of present illness.    All 14 point review of systems negative except as described per history of present illness  EKGs/Labs/Other Studies Reviewed:      Recent Labs: No results found for requested labs within last 8760 hours.  Recent Lipid Panel No results found for: CHOL, TRIG, HDL, CHOLHDL, VLDL, LDLCALC, LDLDIRECT  Physical Exam:    VS:  BP 110/66   Pulse (!) 52   Resp 16   Ht 5\' 4"  (1.626 m)   Wt 195 lb 3.2 oz (88.5 kg)   BMI 33.51 kg/m     Wt Readings from Last 3 Encounters:  03/18/17 195 lb 3.2 oz (88.5 kg)     GEN:  Well nourished, well developed in no acute distress HEENT: Normal NECK: No JVD; No carotid bruits LYMPHATICS: No lymphadenopathy CARDIAC: RRR, no murmurs, no rubs, no gallops RESPIRATORY:   Clear to auscultation without rales, wheezing or rhonchi  ABDOMEN: Soft, non-tender, non-distended MUSCULOSKELETAL:  No edema; No deformity  SKIN: Warm and dry LOWER EXTREMITIES: no swelling NEUROLOGIC:  Alert and oriented x 3 PSYCHIATRIC:  Normal affect   ASSESSMENT:    1. Coronary artery disease involving native coronary artery of native heart without angina pectoris   2. Paroxysmal atrial fibrillation (HCC)   3. Tobacco use disorder   4. Chronic respiratory failure with hypoxia and hypercapnia (HCC)    PLAN:    In order of problems listed above:  1. Coronary artery disease: Asymptomatic, on appropriate medication which I will continue present management. 2. Paroxysmal mitral fibrillation. Continue anticoagulation, I will call Primary care physician to get Chem-7 make sure that the dose of Xarelto is appropriate. 3. Tobacco use: Again we had a long discussion about this and told her she must quit she understands the posterior.very much she'll be able to. 4. Chronic respiratory failure: Presents to be stable. We'll continue present management.  I will call my significant results of the tests that she had done within last few months. Apparently she did not receive any phone call about results of those tests. I see her back in my office in about 2-3 weeks we'll repeat visits oldest tests and see what else needs to be done. Because of pain in her legs while walking I will schedule her to have arterial duplex of lower extremities.   Medication Adjustments/Labs and Tests Ordered: Current medicines are reviewed at length with the patient today.  Concerns regarding medicines are outlined above.  No orders of the defined types were placed in this encounter.  Medication changes: No orders of the defined types were placed in this encounter.   Signed, Park Liter, MD, [facility] 03/18/2017 9:42 AM    [place]

## 2017-03-18 NOTE — Patient Instructions (Addendum)
Medication Instructions:  Your physician recommends that you continue on your current medications as directed. Please refer to the Current Medication list given to you today.  Labwork: We are obtaining the results from [facility] in [place].   Testing/Procedures: Your physician has requested that you have a lower extremity arterial duplex. This test is an ultrasound of the arteries in the legs or arms. It looks at arterial blood flow in the legs and arms. Allow one hour for Lower Arterial scans. There are no restrictions or special instructions  Follow-Up: Your physician recommends that you schedule a follow-up appointment in: 2-3 weeks with Dr. Agustin Cree..  Any Other Special Instructions Will Be Listed Below (If Applicable).     If you need a refill on your cardiac medications before your next appointment, please call your pharmacy.

## 2017-03-31 ENCOUNTER — Telehealth: Payer: Self-pay

## 2017-03-31 NOTE — Telephone Encounter (Signed)
Pt advised of results of vascular study and encouraged that Dr. Agustin Cree will discuss results further at her f/u next week.

## 2017-04-01 ENCOUNTER — Ambulatory Visit (INDEPENDENT_AMBULATORY_CARE_PROVIDER_SITE_OTHER): Payer: Medicare Other | Admitting: Neurology

## 2017-04-01 ENCOUNTER — Ambulatory Visit (INDEPENDENT_AMBULATORY_CARE_PROVIDER_SITE_OTHER): Payer: Self-pay | Admitting: Neurology

## 2017-04-01 ENCOUNTER — Encounter: Payer: Self-pay | Admitting: Neurology

## 2017-04-01 DIAGNOSIS — M5441 Lumbago with sciatica, right side: Secondary | ICD-10-CM

## 2017-04-01 DIAGNOSIS — G8929 Other chronic pain: Secondary | ICD-10-CM | POA: Diagnosis not present

## 2017-04-01 DIAGNOSIS — F17200 Nicotine dependence, unspecified, uncomplicated: Secondary | ICD-10-CM

## 2017-04-01 NOTE — Progress Notes (Signed)
    [place]    Nerve / Sites Muscle Latency Ref. Amplitude Ref. Rel Amp Segments Distance Velocity Ref. Area    ms ms mV mV %  cm m/s m/s mVms  L Peroneal - EDB     Ankle EDB 6.9 ?6.5 2.2 ?2.0 100 Ankle - EDB 9   7.7     Fib head EDB 15.2  2.3  106 Fib head - Ankle 38 46 ?44 9.2     Pop fossa EDB 17.0  2.2  92.7 Pop fossa - Fib head 10 53 ?44 8.6         Pop fossa - Ankle      R Peroneal - EDB     Ankle EDB 5.5 ?6.5 2.3 ?2.0 100 Ankle - EDB 9   9.4     Fib head EDB 13.1  2.3  102 Fib head - Ankle 38 50 ?44 10.1     Pop fossa EDB 14.9  2.1  90.1 Pop fossa - Fib head 10 55 ?44 8.4         Pop fossa - Ankle      L Tibial - AH     Ankle AH 7.3 ?5.8 12.2 ?4.0 100 Ankle - AH 9   29.1     Pop fossa AH 17.4  5.9  48.2 Pop fossa - Ankle 43 43 ?41 18.8  R Tibial - AH     Ankle AH 6.7 ?5.8 8.0 ?4.0 100 Ankle - AH 9   21.6     Pop fossa AH 16.1  5.0  61.8 Pop fossa - Ankle 43 45 ?41 16.4             SNC    Nerve / Sites Rec. Site Peak Lat Ref.  Amp Ref. Segments Distance    ms ms V V  cm  R Superficial peroneal - Ankle     Lat leg Ankle NR ?4.4 NR ?6 Lat leg - Ankle 14  L Superficial peroneal - Ankle     Lat leg Ankle NR ?4.4 NR ?6 Lat leg - Ankle 14         H Reflex    Nerve H Lat Lat Hmax   ms ms   Left Right Ref. Left Right Ref.  Tibial - [place] 32.7 33.0 ?35.0 38.4 38.3 ?35.0

## 2017-04-01 NOTE — Progress Notes (Signed)
Please refer to EMG and nerve conduction study procedure note. 

## 2017-04-01 NOTE — Procedures (Signed)
     HISTORY:  Misty Payne is a 71 year old patient with a history of diabetes who reports a two-year history of back pain and pain radiating into the right hip and down the leg to the right knee. The patient indicates that the pain is much more severe when she is bearing weight and this impairs her ability to ambulate longer distances. She is being evaluated for this issue. She does report some numbness in both feet.  NERVE CONDUCTION STUDIES:  Nerve conduction studies were performed on both lower extremities. The distal motor latencies for the peroneal nerves were prolonged on the left, normal on the right, with normal motor amplitudes for these nerves bilaterally. The distal motor latencies for the posterior tibial nerves were prolonged bilaterally with normal motor amplitudes for these nerves bilaterally. The nerve conduction velocities for the peroneal and posterior tibial nerves were normal bilaterally. The peroneal sensory latencies were unobtainable bilaterally. The H reflex latencies were within normal limits bilaterally.  EMG STUDIES:  EMG study was performed on the right lower extremity:  The tibialis anterior muscle reveals 2 to 4K motor units with full recruitment. No fibrillations or positive waves were seen. The peroneus tertius muscle reveals 2 to 5K motor units with decreased recruitment. No fibrillations or positive waves were seen. The medial gastrocnemius muscle reveals 1 to 3K motor units with decreased recruitment. No fibrillations or positive waves were seen. The vastus lateralis muscle reveals 2 to 4K motor units with full recruitment. No fibrillations or positive waves were seen. The iliopsoas muscle reveals 2 to 4K motor units with full recruitment. No fibrillations or positive waves were seen. The biceps femoris muscle (long head) reveals 2 to 4K motor units with full recruitment. No fibrillations or positive waves were seen. The lumbosacral paraspinal muscles were  tested at 3 levels, and revealed no abnormalities of insertional activity at all 3 levels tested. There was good relaxation.   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 EMG evaluation the right lower extremity shows some mild chronic stable signs of denervation distally consistent with the diagnosis of peripheral neuropathy. There is no clear evidence of an overlying lumbosacral radiculopathy.  Jill Alexanders MD 04/01/2017 1:51 PM  [facility] [address] [place], [place] [address]  Phone [phone] Fax [phone]

## 2017-04-08 ENCOUNTER — Encounter: Payer: Self-pay | Admitting: Cardiology

## 2017-04-08 ENCOUNTER — Ambulatory Visit (INDEPENDENT_AMBULATORY_CARE_PROVIDER_SITE_OTHER): Payer: Medicare Other | Admitting: Cardiology

## 2017-04-08 VITALS — BP 110/64 | HR 60 | Resp 14 | Ht 64.0 in | Wt 193.0 lb

## 2017-04-08 DIAGNOSIS — I480 Paroxysmal atrial fibrillation: Secondary | ICD-10-CM

## 2017-04-08 DIAGNOSIS — I2510 Atherosclerotic heart disease of native coronary artery without angina pectoris: Secondary | ICD-10-CM | POA: Diagnosis not present

## 2017-04-08 DIAGNOSIS — F17200 Nicotine dependence, unspecified, uncomplicated: Secondary | ICD-10-CM

## 2017-04-08 DIAGNOSIS — I739 Peripheral vascular disease, unspecified: Secondary | ICD-10-CM | POA: Insufficient documentation

## 2017-04-08 NOTE — Patient Instructions (Signed)
Medication Instructions:  Your physician recommends that you continue on your current medications as directed. Please refer to the Current Medication list given to you today.  Labwork: None   Testing/Procedures: None   Follow-Up: Your physician recommends that you schedule a follow-up appointment in: 2 months; Also you will be hearing something regarding a vascular appointment once Dr. Agustin Cree speaks with some of our vascular surgeons.   Any Other Special Instructions Will Be Listed Below (If Applicable).  Please note that any paperwork needing to be filled out by the provider will need to be addressed at the front desk prior to seeing the provider. Please note that any paperwork FMLA, Disability or other documents regarding health condition is subject to a $25.00 charge that must be received prior to completion of paperwork in the form of a money order or check.    If you need a refill on your cardiac medications before your next appointment, please call your pharmacy.

## 2017-04-08 NOTE — Progress Notes (Signed)
Cardiology Office Note:    Date:  04/08/2017   ID:  Misty Payne, Misty Payne 1946-03-01, MRN [id number]  PCP:  Aldona Bar, MD  Cardiologist:  Jenne Campus, MD    Referring MD: Aldona Bar, MD   Chief Complaint  Patient presents with  . 3 week follow up  She is complaining of having pain in her legs  History of Present Illness:    Misty Payne is a 71 y.o. female  with complex past medical history which includes chronic smoking, peripheral vascular disease, history of remote coronary artery disease, paroxysmal in prep fibrillation, COPD. She presented to my office today to discuss results of her arterial study of lower extremities. Still complaining of exertional pain in her lower extremities especially right extremities that give her problem. Just walking from her car to our office created pain to the point that she had stopped. Recently she was also evaluated for possible neuropathy and results of the test showed moderate uropathy, she was given Garbapentin apparently stopped because of some side effect of the medication. Denies having cardiac complaints, no palpitations no chest pain tightness squeezing pressure burning chest clearly biggest complaint is claudication. Years ago she was evaluated for possible vascular intervention however she end up being sick in the hospital because of sepsis and that workup was never completed.  Past Medical History:  Diagnosis Date  . Cancer ([place])   . Depression   . Diabetes mellitus without complication ([place])   . GERD (gastroesophageal reflux disease)   . History of blood transfusion   . IBS (irritable bowel syndrome)   . Thyroid disease     Past Surgical History:  Procedure Laterality Date  . APPENDECTOMY    . BLADDER SUSPENSION    . CAROTID ENDARTERECTOMY    . CAROTID STENT    . RENAL ARTERY STENT    . THYROIDECTOMY    . TONSILLECTOMY AND ADENOIDECTOMY    . TOTAL ABDOMINAL HYSTERECTOMY W/ BILATERAL  SALPINGOOPHORECTOMY      Current Medications: Current Meds  Medication Sig  . DOCOSAHEXAENOIC ACID PO Take 2 g by mouth daily.  Marland Kitchen ezetimibe (ZETIA) 10 MG tablet Take 10 mg by mouth daily.  Marland Kitchen FLUoxetine (PROZAC) 20 MG capsule Take 20 mg by mouth daily.  . furosemide (LASIX) 20 MG tablet Take 20 mg by mouth daily.  Marland Kitchen glipiZIDE (GLUCOTROL) 5 MG tablet Take 10 mg by mouth 2 (two) times daily.  . insulin glargine (LANTUS) 100 UNIT/ML injection Inject 20 Units into the skin at bedtime.  . insulin lispro (HUMALOG) 100 UNIT/ML injection Inject 42 Units into the skin 2 (two) times daily.   Marland Kitchen levothyroxine (SYNTHROID, LEVOTHROID) 100 MCG tablet Take 1 tablet by mouth daily.  Marland Kitchen lisinopril (PRINIVIL,ZESTRIL) 10 MG tablet Take 10 mg by mouth daily.  . pantoprazole (PROTONIX) 40 MG tablet Take 40 mg by mouth at bedtime.  . Probiotic Product (PROBIOTIC-10 PO) Take 1 tablet by mouth daily.  . sotalol (BETAPACE) 80 MG tablet Take 80 mg by mouth 2 (two) times daily.  Alveda Reasons 15 MG TABS tablet Take 1 tablet (15 mg total) by mouth daily.     Allergies:   Gabapentin; Tetanus toxoids; Aspirin; Codeine; Dextromethorphan; Statins; Latex; Lidocaine; Morpholine salicylate; Naloxone hcl; Oxycodone-acetaminophen; Pentazocine; and Tape   Social History   Social History  . Marital status: Legally Separated    Spouse name: N/A  . Number of children: N/A  . Years of education: N/A   Social History Main Topics  .  Smoking status: Current Every Day Smoker  . Smokeless tobacco: Never Used  . Alcohol use No  . Drug use: No  . Sexual activity: Not Asked   Other Topics Concern  . None   Social History Narrative  . None     Family History: The patient's family history includes Cancer in her father and mother; Heart disease in her father and mother. ROS:   Please see the history of present illness.    All 14 point review of systems negative except as described per history of present  illness  EKGs/Labs/Other Studies Reviewed:      Recent Labs: No results found for requested labs within last 8760 hours.  Recent Lipid Panel No results found for: CHOL, TRIG, HDL, CHOLHDL, VLDL, LDLCALC, LDLDIRECT  Physical Exam:    VS:  BP 110/64   Pulse 60   Resp 14   Ht 5\' 4"  (1.626 m)   Wt 193 lb (87.5 kg)   BMI 33.13 kg/m     Wt Readings from Last 3 Encounters:  04/08/17 193 lb (87.5 kg)  03/18/17 195 lb 3.2 oz (88.5 kg)     GEN:  Well nourished, well developed in no acute distress HEENT: Normal NECK: No JVD; No carotid bruits LYMPHATICS: No lymphadenopathy CARDIAC: RRR, no murmurs, no rubs, no gallops RESPIRATORY:  Clear to auscultation without rales, wheezing or rhonchi  ABDOMEN: Soft, non-tender, non-distended MUSCULOSKELETAL:  No edema; No deformity  SKIN: Warm and dry LOWER EXTREMITIES: no swelling, Pulses absent, decent capillary refill NEUROLOGIC:  Alert and oriented x 3 PSYCHIATRIC:  Normal affect   ASSESSMENT:    1. Coronary artery disease involving native coronary artery of native heart without angina pectoris   2. Paroxysmal atrial fibrillation (HCC)   3. Tobacco use disorder   4. Claudication in peripheral vascular disease ([place])    PLAN:    In order of problems listed above:  1. Coronary artery disease with remote problem likely from that point of view she seems to be stable we'll continue present management. 2. Paroxysmal mitral fibrillation: Continue anticoagulation: As having a recent episode. 3. Smoking obviously tremendous problem I had again discussed with her vaporizer to quit she bluntly told me that she will not be able to. Hopefully when they be able to convince her at least try. 4. Claudications with study done at [facility] showing severe right lower extremities and moderate left lower extremity arterial occlusive disease. I will refer her to possible surgery with consideration for revascularization.   Medication  Adjustments/Labs and Tests Ordered: Current medicines are reviewed at length with the patient today.  Concerns regarding medicines are outlined above.  No orders of the defined types were placed in this encounter.  Medication changes: No orders of the defined types were placed in this encounter.   Signed, Park Liter, MD, [facility] 04/08/2017 11:18 AM    [place]

## 2017-04-10 NOTE — Addendum Note (Signed)
Addended by: Kathyrn Sheriff on: 04/10/2017 04:39 PM   Modules accepted: Orders

## 2017-04-13 ENCOUNTER — Telehealth: Payer: Self-pay

## 2017-04-13 MED ORDER — RIVAROXABAN 15 MG PO TABS: 15.0000 mg | ORAL_TABLET | Freq: Every day | ORAL | 2 refills | Status: DC

## 2017-04-13 NOTE — Telephone Encounter (Signed)
Refill Request sent from patient's mail order pharmacy. 90 days with refills sent in for Xarelto.

## 2017-04-15 ENCOUNTER — Other Ambulatory Visit: Payer: Self-pay

## 2017-04-15 DIAGNOSIS — I739 Peripheral vascular disease, unspecified: Secondary | ICD-10-CM

## 2017-04-23 ENCOUNTER — Other Ambulatory Visit: Payer: Self-pay | Admitting: *Deleted

## 2017-04-24 MED ORDER — SOTALOL HCL 80 MG PO TABS: 80.0000 mg | ORAL_TABLET | Freq: Two times a day (BID) | ORAL | 2 refills | Status: DC

## 2017-04-24 MED ORDER — RIVAROXABAN 15 MG PO TABS: 15.0000 mg | ORAL_TABLET | Freq: Every day | ORAL | 2 refills | Status: DC

## 2017-05-28 ENCOUNTER — Telehealth: Payer: Self-pay

## 2017-05-28 ENCOUNTER — Other Ambulatory Visit: Payer: Self-pay

## 2017-05-28 ENCOUNTER — Telehealth: Payer: Self-pay | Admitting: Cardiology

## 2017-05-28 MED ORDER — SOTALOL HCL 80 MG PO TABS: 80.0000 mg | ORAL_TABLET | Freq: Two times a day (BID) | ORAL | 2 refills | Status: DC

## 2017-05-28 NOTE — Telephone Encounter (Signed)
Refills sent to [facility].

## 2017-05-28 NOTE — Telephone Encounter (Signed)
Call sotalol to [facility]

## 2017-05-28 NOTE — Telephone Encounter (Signed)
refills sent

## 2017-06-08 ENCOUNTER — Ambulatory Visit: Payer: Medicare Other | Admitting: Cardiology

## 2017-06-30 ENCOUNTER — Ambulatory Visit (INDEPENDENT_AMBULATORY_CARE_PROVIDER_SITE_OTHER): Payer: Medicare HMO | Admitting: Cardiology

## 2017-06-30 ENCOUNTER — Encounter: Payer: Self-pay | Admitting: Cardiology

## 2017-06-30 VITALS — BP 120/64 | HR 52 | Resp 14 | Ht 64.0 in | Wt 193.0 lb

## 2017-06-30 DIAGNOSIS — F17200 Nicotine dependence, unspecified, uncomplicated: Secondary | ICD-10-CM | POA: Diagnosis not present

## 2017-06-30 DIAGNOSIS — I2510 Atherosclerotic heart disease of native coronary artery without angina pectoris: Secondary | ICD-10-CM

## 2017-06-30 DIAGNOSIS — I739 Peripheral vascular disease, unspecified: Secondary | ICD-10-CM | POA: Diagnosis not present

## 2017-06-30 DIAGNOSIS — I480 Paroxysmal atrial fibrillation: Secondary | ICD-10-CM | POA: Diagnosis not present

## 2017-06-30 NOTE — Patient Instructions (Signed)
Medication Instructions:  Your physician recommends that you continue on your current medications as directed. Please refer to the Current Medication list given to you today.  Labwork: Your physician recommends that you have lab work today to check your cholesterol.  Testing/Procedures: None  Dr. Agustin Cree has referred you to Dr. Quay Burow to discuss pain in your things and legs. You will receive a call regarding an appointment.   Follow-Up: Your physician recommends that you schedule a follow-up appointment in: 3 months   Any Other Special Instructions Will Be Listed Below (If Applicable).  Please note that any paperwork needing to be filled out by the provider will need to be addressed at the front desk prior to seeing the provider. Please note that any paperwork FMLA, Disability or other documents regarding health condition is subject to a $25.00 charge that must be received prior to completion of paperwork in the form of a money order or check.    If you need a refill on your cardiac medications before your next appointment, please call your pharmacy.

## 2017-06-30 NOTE — Progress Notes (Signed)
Cardiology Office Note:    Date:  06/30/2017   ID:  Misty Payne, Misty Payne 1945-12-13, MRN [id number]  PCP:  Aldona Bar, MD  Cardiologist:  Jenne Campus, MD    Referring MD: Aldona Bar, MD   Chief Complaint  Patient presents with  . 2 month follow up  Still complaining of having leg pains  History of Present Illness:    Misty Payne is a 71 y.o. female with multiple medical problems.  She does have peripheral vascular disease and complaint of having pain in her Thighs while walking.  She actually had difficulty walking from the parking lot to our office.  This is a chronic problem has been going on for a long period of time she doubted that this is related to vascular problem but I did review her study done in [facility] clearly she does have peripheral vascular disease which is quite advanced.  I think she can benefit from interventional consult.  Denies having any chest pain tightness squeezing pressure burning chest.  Shortness of breath with exertion is like always.  Said that she continue to smoke.  She is intolerant to statin however I will check her cholesterol today and talk to her again about PCSK 9 agent.  This time she accepted offer of going on that class of medication.  Past Medical History:  Diagnosis Date  . Cancer ([place])   . Depression   . Diabetes mellitus without complication ([place])   . GERD (gastroesophageal reflux disease)   . History of blood transfusion   . IBS (irritable bowel syndrome)   . Thyroid disease     Past Surgical History:  Procedure Laterality Date  . APPENDECTOMY    . BLADDER SUSPENSION    . CAROTID ENDARTERECTOMY    . CAROTID STENT    . RENAL ARTERY STENT    . THYROIDECTOMY    . TONSILLECTOMY AND ADENOIDECTOMY    . TOTAL ABDOMINAL HYSTERECTOMY W/ BILATERAL SALPINGOOPHORECTOMY      Current Medications: Current Meds  Medication Sig  . DOCOSAHEXAENOIC ACID PO Take 2 g by mouth daily.  Marland Kitchen ezetimibe (ZETIA) 10  MG tablet Take 10 mg by mouth daily.  Marland Kitchen FLUoxetine (PROZAC) 20 MG capsule Take 20 mg by mouth daily.  . furosemide (LASIX) 20 MG tablet Take 20 mg by mouth daily.  Marland Kitchen glipiZIDE (GLUCOTROL) 5 MG tablet Take 10 mg by mouth 2 (two) times daily.  . insulin glargine (LANTUS) 100 UNIT/ML injection Inject 20 Units into the skin at bedtime.  . insulin lispro (HUMALOG) 100 UNIT/ML injection Inject 42 Units into the skin 2 (two) times daily.   Marland Kitchen levothyroxine (SYNTHROID, LEVOTHROID) 100 MCG tablet Take 1 tablet by mouth daily.  Marland Kitchen lisinopril (PRINIVIL,ZESTRIL) 10 MG tablet Take 10 mg by mouth daily.  . pantoprazole (PROTONIX) 40 MG tablet Take 40 mg by mouth at bedtime.  . Probiotic Product (PROBIOTIC-10 PO) Take 1 tablet by mouth daily.  . Rivaroxaban (XARELTO) 15 MG TABS tablet Take 1 tablet (15 mg total) by mouth daily.  . sotalol (BETAPACE) 80 MG tablet Take 1 tablet (80 mg total) by mouth 2 (two) times daily.     Allergies:   Gabapentin; Tetanus toxoids; Aspirin; Codeine; Dextromethorphan; Statins; Latex; Lidocaine; Morpholine salicylate; Naloxone hcl; Oxycodone-acetaminophen; Pentazocine; and Tape   Social History   Socioeconomic History  . Marital status: Legally Separated    Spouse name: None  . Number of children: None  . Years of education: None  . Highest  education level: None  Social Needs  . Financial resource strain: None  . Food insecurity - worry: None  . Food insecurity - inability: None  . Transportation needs - medical: None  . Transportation needs - non-medical: None  Occupational History  . None  Tobacco Use  . Smoking status: Current Every Day Smoker  . Smokeless tobacco: Never Used  Substance and Sexual Activity  . Alcohol use: No  . Drug use: No  . Sexual activity: None  Other Topics Concern  . None  Social History Narrative  . None     Family History: The patient's family history includes Cancer in her father and mother; Heart disease in her father and  mother. ROS:   Please see the history of present illness.    All 14 point review of systems negative except as described per history of present illness  EKGs/Labs/Other Studies Reviewed:      Recent Labs: No results found for requested labs within last 8760 hours.  Recent Lipid Panel No results found for: CHOL, TRIG, HDL, CHOLHDL, VLDL, LDLCALC, LDLDIRECT  Physical Exam:    VS:  BP 120/64   Pulse (!) 52   Resp 14   Ht 5\' 4"  (1.626 m)   Wt 193 lb (87.5 kg)   BMI 33.13 kg/m     Wt Readings from Last 3 Encounters:  06/30/17 193 lb (87.5 kg)  04/08/17 193 lb (87.5 kg)  03/18/17 195 lb 3.2 oz (88.5 kg)     GEN:  Well nourished, well developed in no acute distress HEENT: Normal NECK: No JVD; No carotid bruits LYMPHATICS: No lymphadenopathy CARDIAC: RRR, no murmurs, no rubs, no gallops RESPIRATORY:  Clear to auscultation without rales, wheezing or rhonchi  ABDOMEN: Soft, non-tender, non-distended MUSCULOSKELETAL:  No edema; No deformity  SKIN: Warm and dry LOWER EXTREMITIES: no swelling NEUROLOGIC:  Alert and oriented x 3 PSYCHIATRIC:  Normal affect   ASSESSMENT:    1. Coronary artery disease involving native coronary artery of native heart without angina pectoris   2. Paroxysmal atrial fibrillation (HCC)   3. Claudication in peripheral vascular disease ([place])   4. Tobacco use disorder    PLAN:    In order of problems listed above:  1. Coronary artery disease: Stable we will continue present management. 2. Paroxysmal atrial fibrillation anticoagulated which I will continue, her chads 2 Vascor equals 6. 3. Claudication with peripheral vascular disease: She will be refer to vascular interventionalist for potential consideration 4. Smoking: Obviously still a big problem I spoke to her again but she told me straight that she is not interested in quitting   Medication Adjustments/Labs and Tests Ordered: Current medicines are reviewed at length with the patient today.   Concerns regarding medicines are outlined above.  No orders of the defined types were placed in this encounter.  Medication changes: No orders of the defined types were placed in this encounter.   Signed, Park Liter, MD, [facility] 06/30/2017 11:32 AM    [place]

## 2017-07-01 LAB — LIPID PANEL
CHOL/HDL RATIO: 6.2 ratio — AB (ref 0.0–4.4)
Cholesterol, Total: 217 mg/dL — ABNORMAL HIGH (ref 100–199)
HDL: 35 mg/dL — ABNORMAL LOW (ref >39)
LDL CALC: 122 mg/dL — AB (ref 0–99)
Triglycerides: 300 mg/dL — ABNORMAL HIGH (ref 0–149)
VLDL CHOLESTEROL CAL: 60 mg/dL — AB (ref 5–40)

## 2017-10-14 ENCOUNTER — Ambulatory Visit: Payer: Medicare HMO | Admitting: Cardiology

## 2017-11-02 ENCOUNTER — Ambulatory Visit: Payer: Medicare HMO | Admitting: Cardiology

## 2017-11-20 DIAGNOSIS — J189 Pneumonia, unspecified organism: Secondary | ICD-10-CM

## 2017-11-20 DIAGNOSIS — J441 Chronic obstructive pulmonary disease with (acute) exacerbation: Secondary | ICD-10-CM | POA: Diagnosis not present

## 2017-11-20 DIAGNOSIS — I480 Paroxysmal atrial fibrillation: Secondary | ICD-10-CM | POA: Diagnosis not present

## 2017-11-20 DIAGNOSIS — J9602 Acute respiratory failure with hypercapnia: Secondary | ICD-10-CM | POA: Diagnosis not present

## 2017-11-20 DIAGNOSIS — R0602 Shortness of breath: Secondary | ICD-10-CM

## 2017-11-20 DIAGNOSIS — J209 Acute bronchitis, unspecified: Secondary | ICD-10-CM | POA: Diagnosis not present

## 2017-11-20 DIAGNOSIS — R001 Bradycardia, unspecified: Secondary | ICD-10-CM | POA: Diagnosis not present

## 2017-11-20 DIAGNOSIS — I4891 Unspecified atrial fibrillation: Secondary | ICD-10-CM

## 2017-11-20 DIAGNOSIS — J440 Chronic obstructive pulmonary disease with acute lower respiratory infection: Secondary | ICD-10-CM | POA: Diagnosis not present

## 2017-11-20 DIAGNOSIS — J9601 Acute respiratory failure with hypoxia: Secondary | ICD-10-CM

## 2017-11-20 DIAGNOSIS — K8012 Calculus of gallbladder with acute and chronic cholecystitis without obstruction: Secondary | ICD-10-CM

## 2017-11-20 IMAGING — DX DG OUTSIDE FILMS CHEST
1 series · 1 of 1 positions shown · non-contrast
Comparison: none

[chest ap]
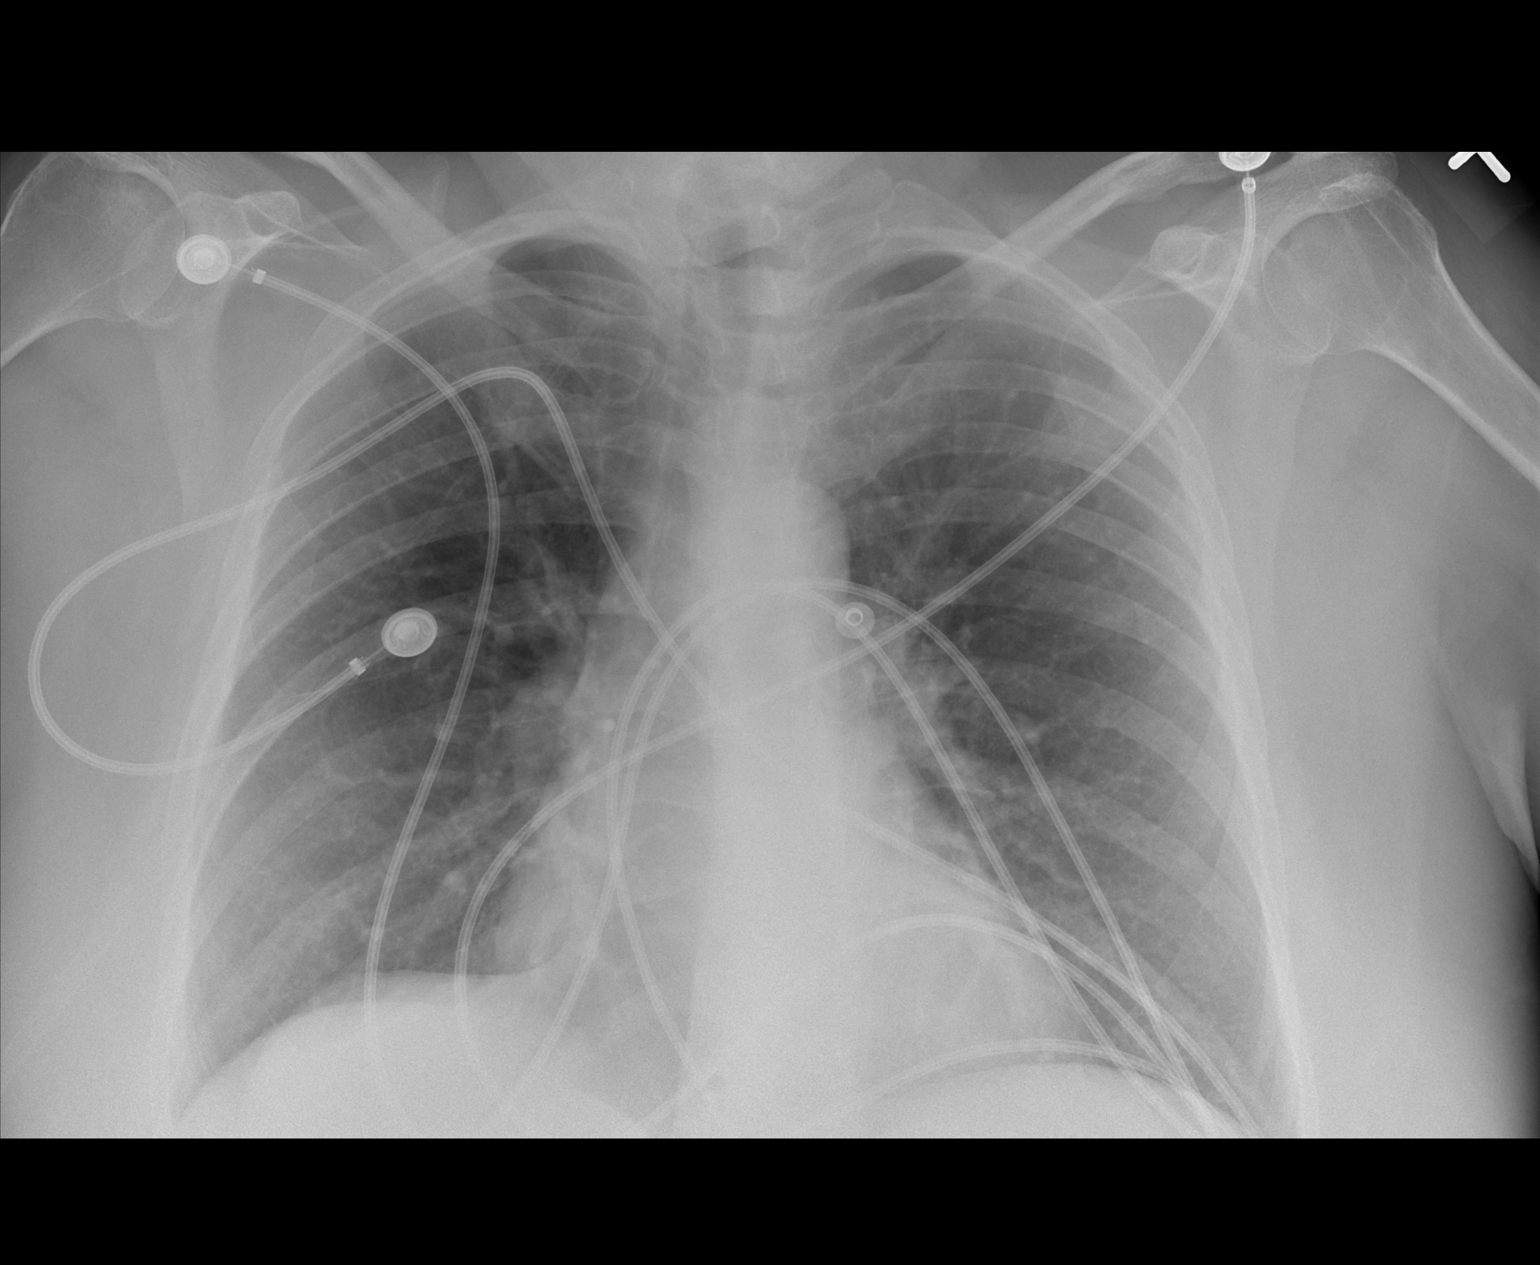

[1 of 1 positions shown; findings below may reference images not displayed]

Canned report from images found in remote index.

Refer to host system for actual result text.

## 2017-11-21 DIAGNOSIS — R0602 Shortness of breath: Secondary | ICD-10-CM | POA: Diagnosis not present

## 2017-11-21 DIAGNOSIS — I4891 Unspecified atrial fibrillation: Secondary | ICD-10-CM | POA: Diagnosis not present

## 2017-11-21 DIAGNOSIS — J9602 Acute respiratory failure with hypercapnia: Secondary | ICD-10-CM | POA: Diagnosis not present

## 2017-11-21 DIAGNOSIS — R001 Bradycardia, unspecified: Secondary | ICD-10-CM | POA: Diagnosis not present

## 2017-11-21 DIAGNOSIS — K8012 Calculus of gallbladder with acute and chronic cholecystitis without obstruction: Secondary | ICD-10-CM | POA: Diagnosis not present

## 2017-11-21 DIAGNOSIS — J189 Pneumonia, unspecified organism: Secondary | ICD-10-CM | POA: Diagnosis not present

## 2017-11-21 DIAGNOSIS — J441 Chronic obstructive pulmonary disease with (acute) exacerbation: Secondary | ICD-10-CM | POA: Diagnosis not present

## 2017-11-21 DIAGNOSIS — J440 Chronic obstructive pulmonary disease with acute lower respiratory infection: Secondary | ICD-10-CM | POA: Diagnosis not present

## 2017-11-21 DIAGNOSIS — J9601 Acute respiratory failure with hypoxia: Secondary | ICD-10-CM | POA: Diagnosis not present

## 2017-11-21 DIAGNOSIS — I480 Paroxysmal atrial fibrillation: Secondary | ICD-10-CM | POA: Diagnosis not present

## 2017-11-21 DIAGNOSIS — I739 Peripheral vascular disease, unspecified: Secondary | ICD-10-CM

## 2017-11-21 DIAGNOSIS — J209 Acute bronchitis, unspecified: Secondary | ICD-10-CM | POA: Diagnosis not present

## 2017-11-22 DIAGNOSIS — J189 Pneumonia, unspecified organism: Secondary | ICD-10-CM | POA: Diagnosis not present

## 2017-11-22 DIAGNOSIS — J9601 Acute respiratory failure with hypoxia: Secondary | ICD-10-CM | POA: Diagnosis not present

## 2017-11-22 DIAGNOSIS — K8012 Calculus of gallbladder with acute and chronic cholecystitis without obstruction: Secondary | ICD-10-CM | POA: Diagnosis not present

## 2017-11-22 DIAGNOSIS — R0602 Shortness of breath: Secondary | ICD-10-CM | POA: Diagnosis not present

## 2017-11-22 DIAGNOSIS — I4891 Unspecified atrial fibrillation: Secondary | ICD-10-CM | POA: Diagnosis not present

## 2017-11-23 DIAGNOSIS — J9601 Acute respiratory failure with hypoxia: Secondary | ICD-10-CM | POA: Diagnosis not present

## 2017-11-23 DIAGNOSIS — K8012 Calculus of gallbladder with acute and chronic cholecystitis without obstruction: Secondary | ICD-10-CM | POA: Diagnosis not present

## 2017-11-23 DIAGNOSIS — J189 Pneumonia, unspecified organism: Secondary | ICD-10-CM | POA: Diagnosis not present

## 2017-11-23 DIAGNOSIS — R0602 Shortness of breath: Secondary | ICD-10-CM | POA: Diagnosis not present

## 2017-11-24 DIAGNOSIS — R0602 Shortness of breath: Secondary | ICD-10-CM | POA: Diagnosis not present

## 2017-11-24 DIAGNOSIS — J9601 Acute respiratory failure with hypoxia: Secondary | ICD-10-CM | POA: Diagnosis not present

## 2017-11-24 DIAGNOSIS — J189 Pneumonia, unspecified organism: Secondary | ICD-10-CM | POA: Diagnosis not present

## 2017-11-24 DIAGNOSIS — I4891 Unspecified atrial fibrillation: Secondary | ICD-10-CM | POA: Diagnosis not present

## 2017-11-24 DIAGNOSIS — K8012 Calculus of gallbladder with acute and chronic cholecystitis without obstruction: Secondary | ICD-10-CM | POA: Diagnosis not present

## 2017-11-25 DIAGNOSIS — R0602 Shortness of breath: Secondary | ICD-10-CM | POA: Diagnosis not present

## 2017-11-25 DIAGNOSIS — J9601 Acute respiratory failure with hypoxia: Secondary | ICD-10-CM | POA: Diagnosis not present

## 2017-11-25 DIAGNOSIS — J189 Pneumonia, unspecified organism: Secondary | ICD-10-CM | POA: Diagnosis not present

## 2017-11-25 DIAGNOSIS — K8012 Calculus of gallbladder with acute and chronic cholecystitis without obstruction: Secondary | ICD-10-CM | POA: Diagnosis not present

## 2017-11-25 DIAGNOSIS — I4891 Unspecified atrial fibrillation: Secondary | ICD-10-CM | POA: Diagnosis not present

## 2017-11-26 ENCOUNTER — Telehealth: Payer: Self-pay | Admitting: Cardiology

## 2017-11-26 NOTE — Telephone Encounter (Signed)
Patient states she was in [facility] from 11/21/2017-11/25/2017 due to bradycardia and hypoxia. Patient was discharged yesterday evening with 2L nasal cannula to use as needed and they made some medication adjustments. Patient states that her discharge instructions said to take sotalol 80 mg in the morning and 40 mg in the evening. Before her hospitalization, she was taking sotalol 80 mg twice daily. Patient wanted to know if that was okay with Dr. Agustin Cree. Explained to the patient that Dr. Agustin Cree wants to see her in the office before making any further medication changes. Patient has hospital follow up appointment tomorrow, 11/26/2017 at 2:40 and we would review everything then. Patient verbalized understanding. No further questions.

## 2017-11-26 NOTE — Telephone Encounter (Signed)
Patient just got out of [facility] and they adjusted her medication. She has questions about the adjustments.

## 2017-11-27 ENCOUNTER — Ambulatory Visit: Payer: Medicare HMO | Admitting: Cardiology

## 2017-11-29 IMAGING — DX DG OUTSIDE FILMS CHEST
1 series · 1 of 1 positions shown · non-contrast
Comparison: none

[chest ap]
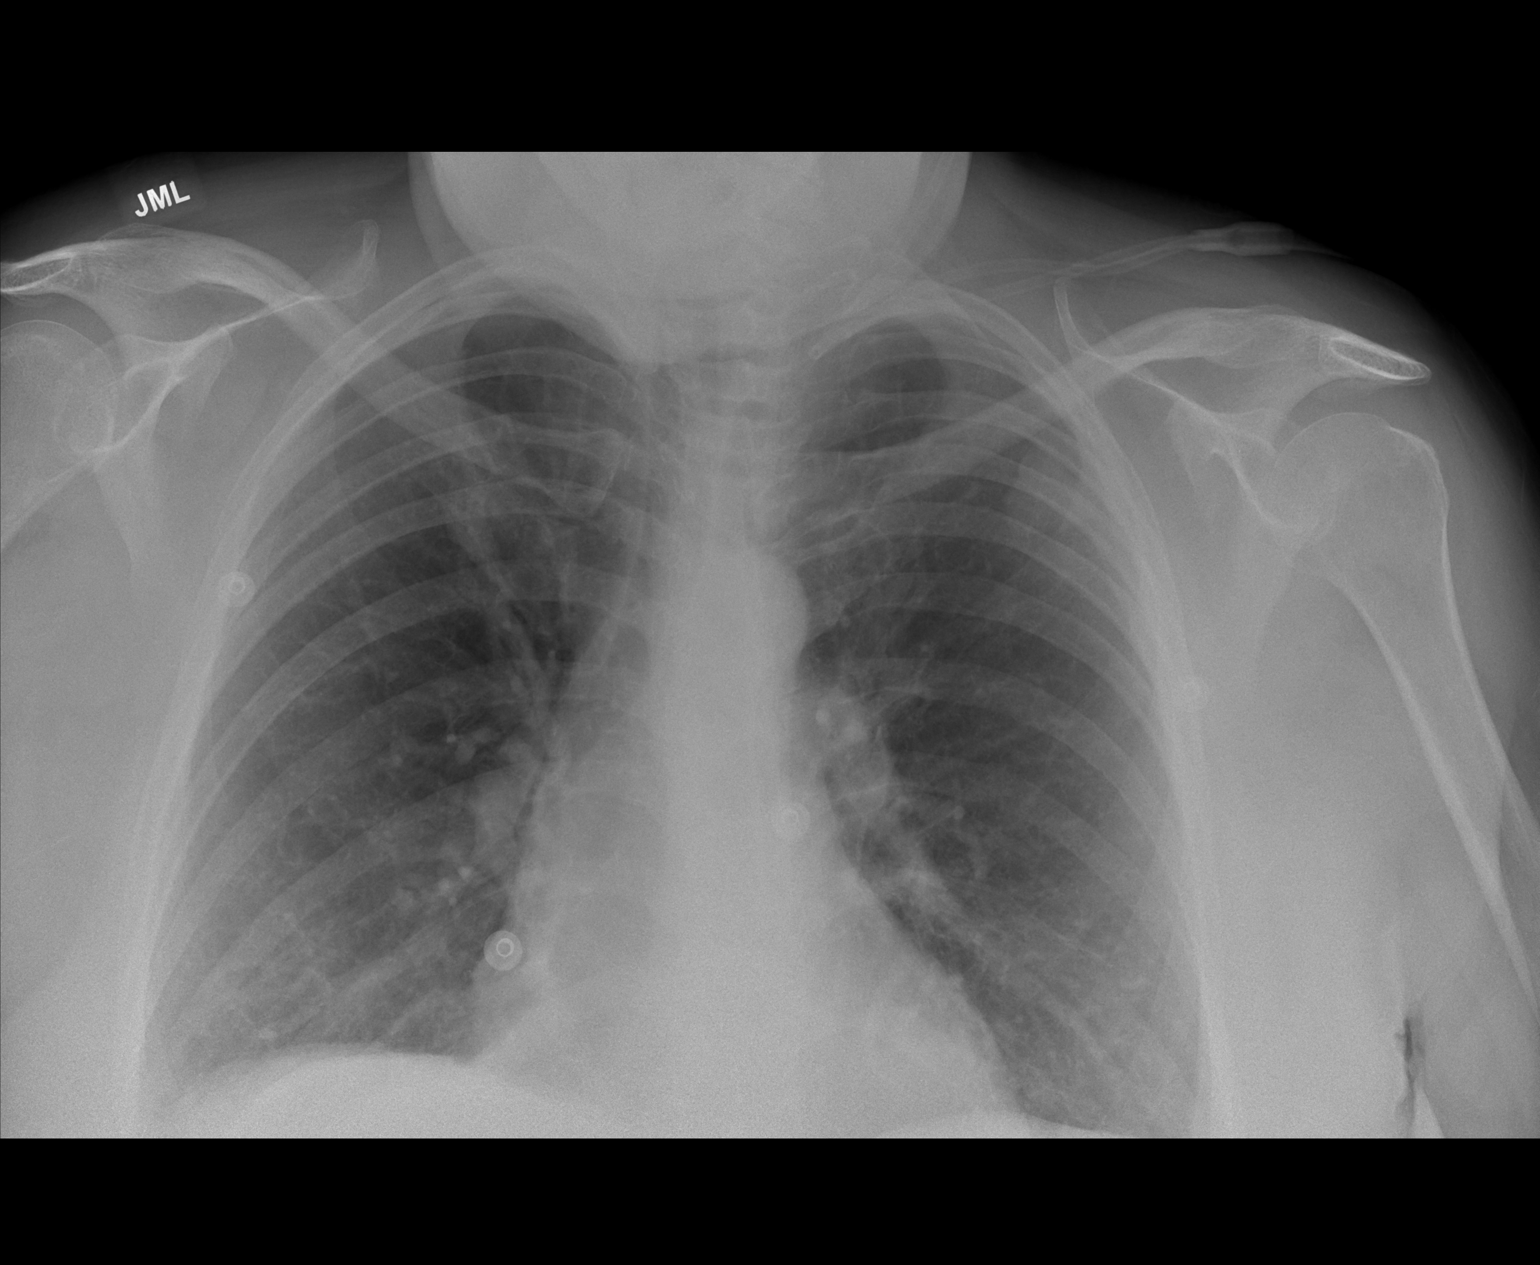

[1 of 1 positions shown; findings below may reference images not displayed]

Canned report from images found in remote index.

Refer to host system for actual result text.

## 2017-12-01 ENCOUNTER — Encounter: Payer: Self-pay | Admitting: Cardiology

## 2017-12-01 ENCOUNTER — Ambulatory Visit (INDEPENDENT_AMBULATORY_CARE_PROVIDER_SITE_OTHER): Payer: Medicare HMO | Admitting: Cardiology

## 2017-12-01 VITALS — BP 122/60 | HR 41 | Ht 64.0 in | Wt 214.0 lb

## 2017-12-01 DIAGNOSIS — I739 Peripheral vascular disease, unspecified: Secondary | ICD-10-CM | POA: Diagnosis not present

## 2017-12-01 DIAGNOSIS — I5032 Chronic diastolic (congestive) heart failure: Secondary | ICD-10-CM | POA: Diagnosis not present

## 2017-12-01 DIAGNOSIS — I480 Paroxysmal atrial fibrillation: Secondary | ICD-10-CM

## 2017-12-01 DIAGNOSIS — I2510 Atherosclerotic heart disease of native coronary artery without angina pectoris: Secondary | ICD-10-CM | POA: Diagnosis not present

## 2017-12-01 DIAGNOSIS — I5030 Unspecified diastolic (congestive) heart failure: Secondary | ICD-10-CM | POA: Insufficient documentation

## 2017-12-01 NOTE — Progress Notes (Signed)
Cardiology Office Note:    Date:  12/01/2017   ID:  Misty, Payne [birth year], MRN [id number]  PCP:  Aldona Bar, MD  Cardiologist:  Jenne Campus, MD    Referring MD: Aldona Bar, MD   Chief Complaint  Patient presents with  . Hospitalization Follow-up  I was in the hospital  History of Present Illness:    Misty Payne is a 72 y.o. female with very complex past medical history that includes coronary artery disease, diastolic congestive heart failure, proximal atrial fibrillation, advanced lung disease.  Recently she was in the hospital because of shortness of breath he was find to have significant gallstone and she ended up having laparoscopic cholecystectomy.  She went to surgery with no major difficulties now recovering however chief complaint right now is weight gain and shortness of breath as well as swelling.  Swelling is much worse at evening time.  Shortness of breath is to the point that she cannot do much.  Does have any chest pain tightness squeezing pressure burning chest.  She was recently discovered to have some mass in her lungs.  She is scheduled to have a PET scan tomorrow.  She is obviously very anxious about that however she told me straight if this is truly a cancer she does not want to have any chemotherapy or radiation therapy.  Past Medical History:  Diagnosis Date  . Cancer ([place])   . Depression   . Diabetes mellitus without complication ([place])   . GERD (gastroesophageal reflux disease)   . History of blood transfusion   . IBS (irritable bowel syndrome)   . Thyroid disease     Past Surgical History:  Procedure Laterality Date  . APPENDECTOMY    . BLADDER SUSPENSION    . CAROTID ENDARTERECTOMY    . CAROTID STENT    . RENAL ARTERY STENT    . THYROIDECTOMY    . TONSILLECTOMY AND ADENOIDECTOMY    . TOTAL ABDOMINAL HYSTERECTOMY W/ BILATERAL SALPINGOOPHORECTOMY      Current Medications: Current Meds  Medication Sig  .  DOCOSAHEXAENOIC ACID PO Take 2 g by mouth daily.  Marland Kitchen ezetimibe (ZETIA) 10 MG tablet Take 10 mg by mouth daily.  Marland Kitchen FLUoxetine (PROZAC) 20 MG capsule Take 20 mg by mouth daily.  . furosemide (LASIX) 20 MG tablet Take 20 mg by mouth daily.  Marland Kitchen glipiZIDE (GLUCOTROL) 5 MG tablet Take 10 mg by mouth 2 (two) times daily.  . insulin glargine (LANTUS) 100 UNIT/ML injection Inject 20 Units into the skin at bedtime.  . insulin lispro (HUMALOG) 100 UNIT/ML injection Inject 42 Units into the skin 2 (two) times daily.   Marland Kitchen levothyroxine (SYNTHROID, LEVOTHROID) 100 MCG tablet Take 1 tablet by mouth daily.  Marland Kitchen lisinopril (PRINIVIL,ZESTRIL) 10 MG tablet Take 10 mg by mouth daily.  . pantoprazole (PROTONIX) 40 MG tablet Take 40 mg by mouth at bedtime.  . Probiotic Product (PROBIOTIC-10 PO) Take 1 tablet by mouth daily.  . Rivaroxaban (XARELTO) 15 MG TABS tablet Take 1 tablet (15 mg total) by mouth daily.  . sotalol (BETAPACE) 80 MG tablet Take 1 tablet (80 mg total) by mouth 2 (two) times daily.     Allergies:   Gabapentin; Tetanus toxoids; Aspirin; Codeine; Dextromethorphan; Statins; Latex; Lidocaine; Morpholine salicylate; Naloxone hcl; Oxycodone-acetaminophen; Pentazocine; and Tape   Social History   Socioeconomic History  . Marital status: Legally Separated    Spouse name: Not on file  . Number of children: Not on file  .  Years of education: Not on file  . Highest education level: Not on file  Occupational History  . Not on file  Social Needs  . Financial resource strain: Not on file  . Food insecurity:    Worry: Not on file    Inability: Not on file  . Transportation needs:    Medical: Not on file    Non-medical: Not on file  Tobacco Use  . Smoking status: Former Research scientist (life sciences)  . Smokeless tobacco: Never Used  Substance and Sexual Activity  . Alcohol use: No  . Drug use: No  . Sexual activity: Not on file  Lifestyle  . Physical activity:    Days per week: Not on file    Minutes per session: Not  on file  . Stress: Not on file  Relationships  . Social connections:    Talks on phone: Not on file    Gets together: Not on file    Attends religious service: Not on file    Active member of club or organization: Not on file    Attends meetings of clubs or organizations: Not on file    Relationship status: Not on file  Other Topics Concern  . Not on file  Social History Narrative  . Not on file     Family History: The patient's family history includes Cancer in her father and mother; Heart disease in her father and mother. ROS:   Please see the history of present illness.    All 14 point review of systems negative except as described per history of present illness  EKGs/Labs/Other Studies Reviewed:      Recent Labs: No results found for requested labs within last 8760 hours.  Recent Lipid Panel    Component Value Date/Time   CHOL 217 (H) 06/30/2017 1150   TRIG 300 (H) 06/30/2017 1150   HDL 35 (L) 06/30/2017 1150   CHOLHDL 6.2 (H) 06/30/2017 1150   LDLCALC 122 (H) 06/30/2017 1150    Physical Exam:    VS:  BP 122/60   Pulse (!) 41   Wt 214 lb (97.1 kg)   SpO2 (!) 82% Comment: 2 lt of O2  BMI 36.73 kg/m     Wt Readings from Last 3 Encounters:  12/01/17 214 lb (97.1 kg)  06/30/17 193 lb (87.5 kg)  04/08/17 193 lb (87.5 kg)     GEN:  Well nourished, well developed in no acute distress HEENT: Normal NECK: No JVD; No carotid bruits LYMPHATICS: No lymphadenopathy CARDIAC: RRR, no murmurs, no rubs, no gallops RESPIRATORY:  Clear to auscultation without rales, wheezing or rhonchi  ABDOMEN: Soft, non-tender, non-distended MUSCULOSKELETAL:  No edema; No deformity  SKIN: Warm and dry LOWER EXTREMITIES: no swelling NEUROLOGIC:  Alert and oriented x 3 PSYCHIATRIC:  Normal affect   ASSESSMENT:    1. Coronary artery disease involving native coronary artery of native heart without angina pectoris   2. Paroxysmal atrial fibrillation (HCC)   3. Claudication in  peripheral vascular disease ([place])   4. Chronic diastolic congestive heart failure, NYHA class 3 (HCC)    PLAN:    In order of problems listed above:  1. Coronary artery disease: Stable no recent issues. 2. Paroxysmal atrial fibrillation will continue with anticoagulation denies having any recent palpitations 3. Diastolic congestive heart rate appears to be mildly decompensated.  I will check a Chem-7 as well as proBNP to see if I will be able to augment diuretic therapy.  She was told recently that she is  dehydrated and her medications were cut down. 4. Claudications and peripheral vascular disease.  This is something that we try to address before but she ended up going to the hospital for other reasons this is something we will talk about the next time when she will be here   Medication Adjustments/Labs and Tests Ordered: Current medicines are reviewed at length with the patient today.  Concerns regarding medicines are outlined above.  No orders of the defined types were placed in this encounter.  Medication changes: No orders of the defined types were placed in this encounter.   Signed, Park Liter, MD, [facility] 12/01/2017 11:20 AM    [place]

## 2017-12-01 NOTE — Patient Instructions (Signed)
Medication Instructions:  Your physician recommends that you continue on your current medications as directed. Please refer to the Current Medication list given to you today.   Labwork: Your physician recommends that you return for lab work today: BMP, Pro-BNP.   Testing/Procedures: None  Follow-Up: Your physician recommends that you schedule a follow-up appointment in: 2 weeks.   Any Other Special Instructions Will Be Listed Below (If Applicable).     If you need a refill on your cardiac medications before your next appointment, please call your pharmacy.

## 2017-12-02 ENCOUNTER — Encounter: Payer: Self-pay | Admitting: Cardiology

## 2017-12-02 LAB — BASIC METABOLIC PANEL
BUN/Creatinine Ratio: 17 (ref 12–28)
BUN: 15 mg/dL (ref 8–27)
CALCIUM: 9.2 mg/dL (ref 8.7–10.3)
CHLORIDE: 94 mmol/L — AB (ref 96–106)
CO2: 42 mmol/L — AB (ref 20–29)
Creatinine, Ser: 0.9 mg/dL (ref 0.57–1.00)
GFR calc Af Amer: 75 mL/min/{1.73_m2} (ref >59)
GFR calc non Af Amer: 65 mL/min/{1.73_m2} (ref >59)
GLUCOSE: 221 mg/dL — AB (ref 65–99)
POTASSIUM: 4.1 mmol/L (ref 3.5–5.2)
SODIUM: 144 mmol/L (ref 134–144)

## 2017-12-02 LAB — PRO B NATRIURETIC PEPTIDE: NT-PRO BNP: 1103 pg/mL — AB (ref 0–301)

## 2017-12-04 ENCOUNTER — Encounter: Payer: Self-pay | Admitting: *Deleted

## 2017-12-08 IMAGING — MR MR OUTSIDE FILMS HEAD/FACE
9 of 11 series · 25 of 48 positions shown · non-contrast
Comparison: none

[Series 3: FLAIR · sagittal · 5.0mm · 0.47mm/px · 1 of 21 slices shown (1 of 4)]
[im 1/21]
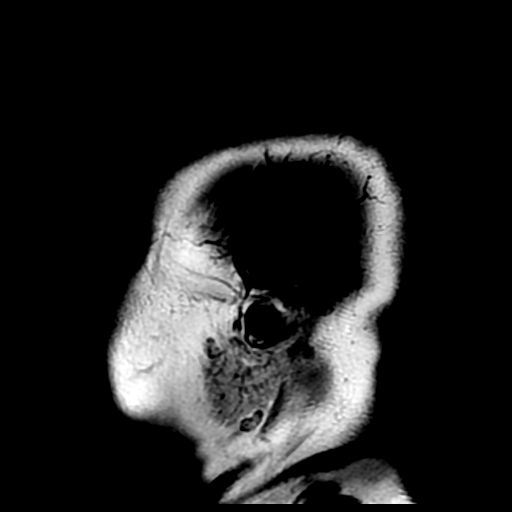

[Series 4: DWI · axial · 3.0mm · 0.94mm/px · z∈[-67,+64]mm · 5 of 67 slices shown (1 of 2)]
[im 1/67]
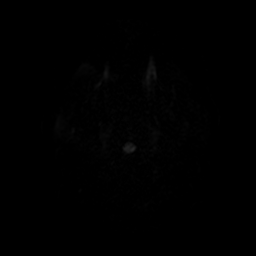
[im 17/67]
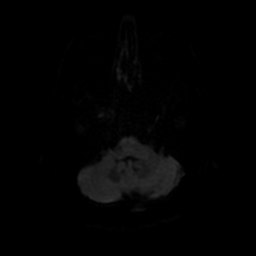
[im 34/67]
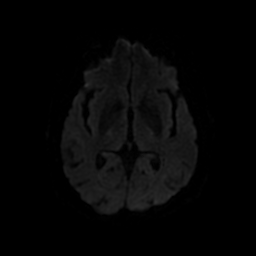
[im 50/67]
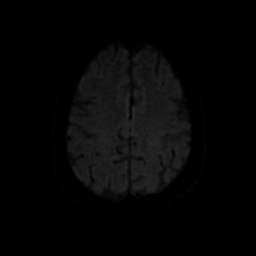
[im 67/67]
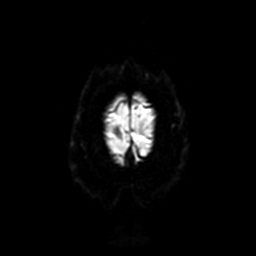

[Series 5: T2 · axial · 5.0mm · 0.47mm/px · z∈[-67,+64]mm · 2 of 23 slices shown (1 of 2)]
[im 1/23]
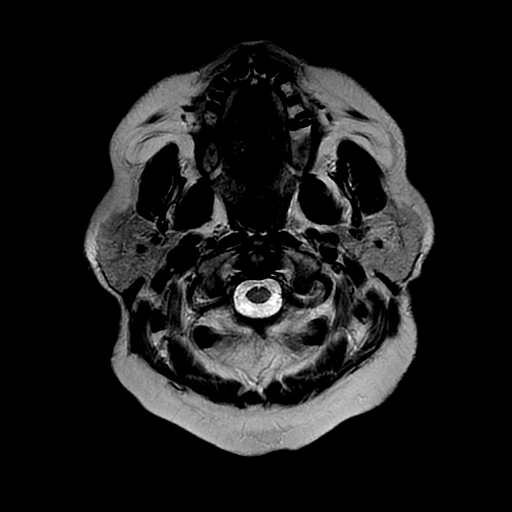
[im 23/23]
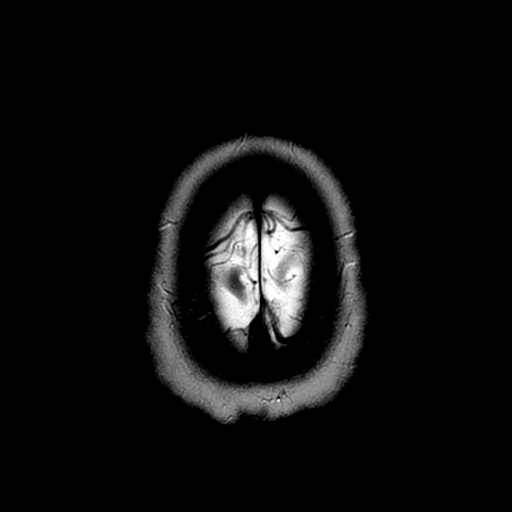

[Series 6: ax fspgr (person_name) · axial · 1.4mm · 0.47mm/px · z∈[-69,+46]mm · 7 of 196 slices shown]
[im 1/196]
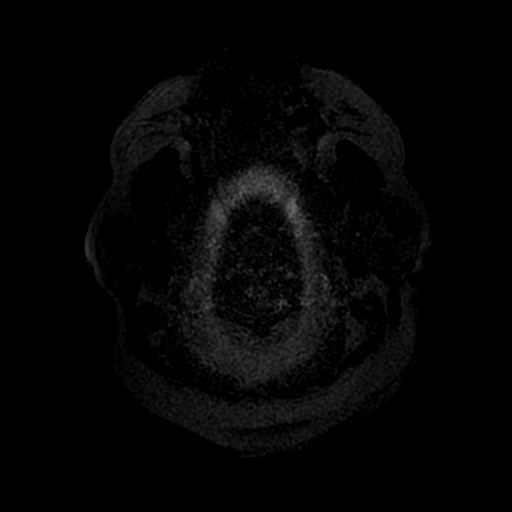
[im 31/196]
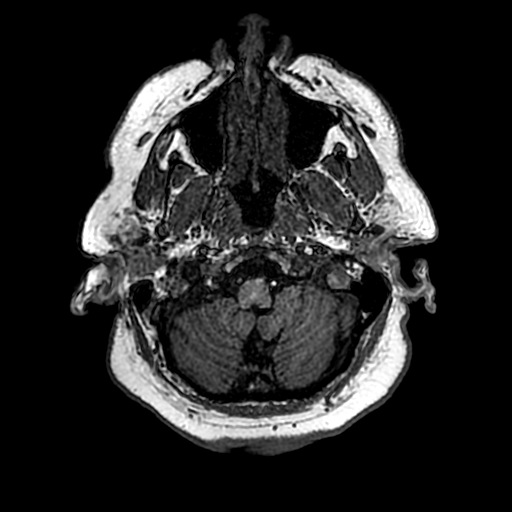
[im 61/196]
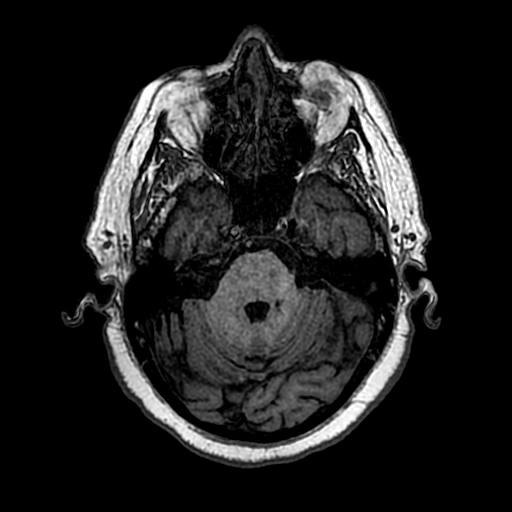
[im 91/196]
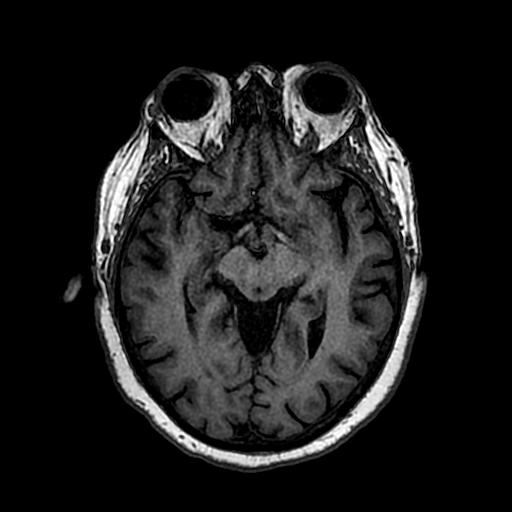
[im 106/196]
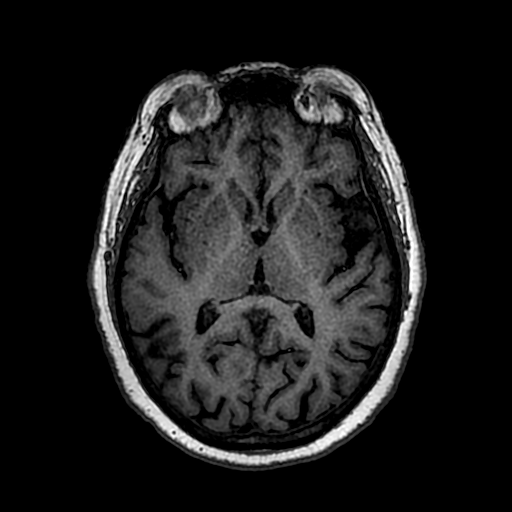
[im 136/196]
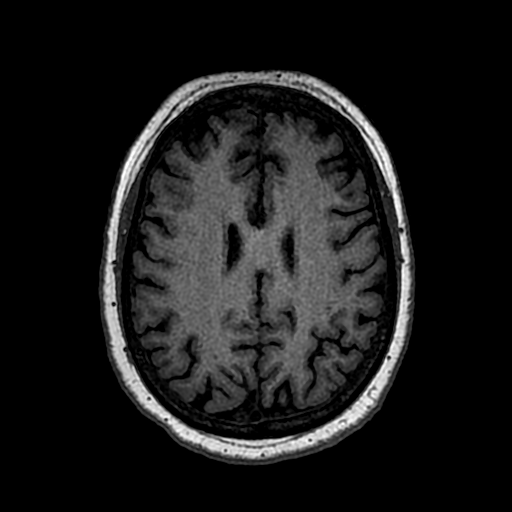
[im 166/196]
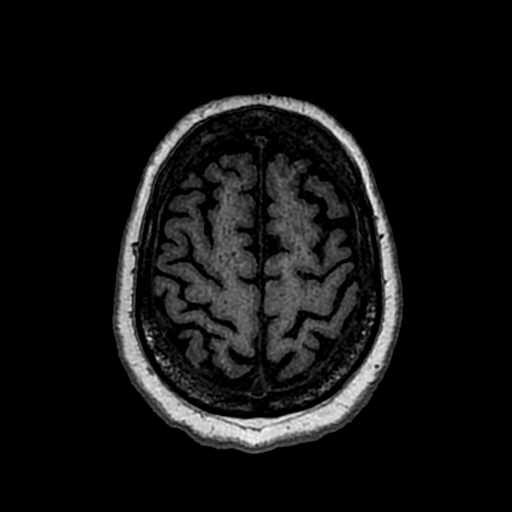

[Series 8: FLAIR · axial · 3.0mm · 0.47mm/px · z∈[-67,+64]mm · 2 of 23 slices shown (2 of 4)]
[im 1/23]
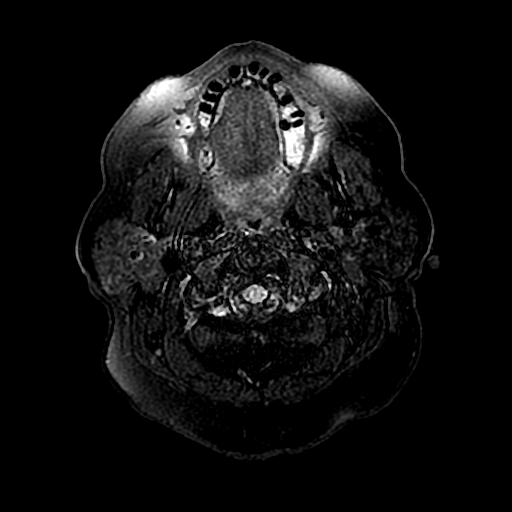
[im 23/23]
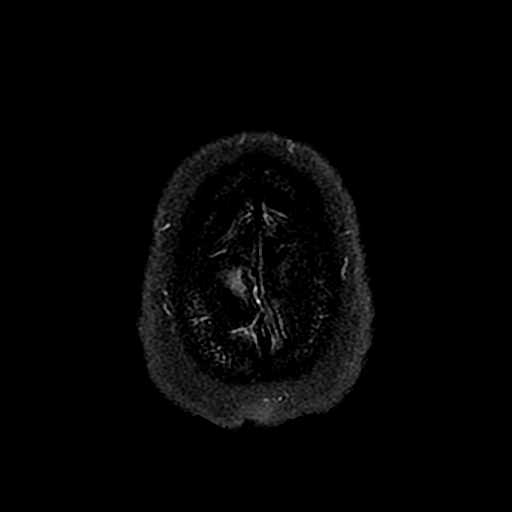

[Series 9: T2 · coronal · 5.0mm · 0.47mm/px · 2 of 27 slices shown (2 of 2)]
[im 1/27]
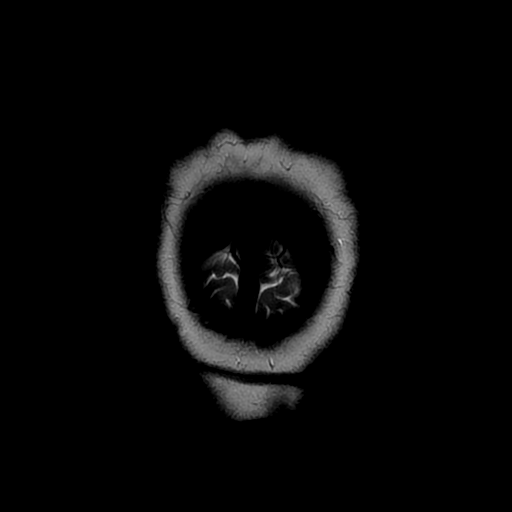
[im 27/27]
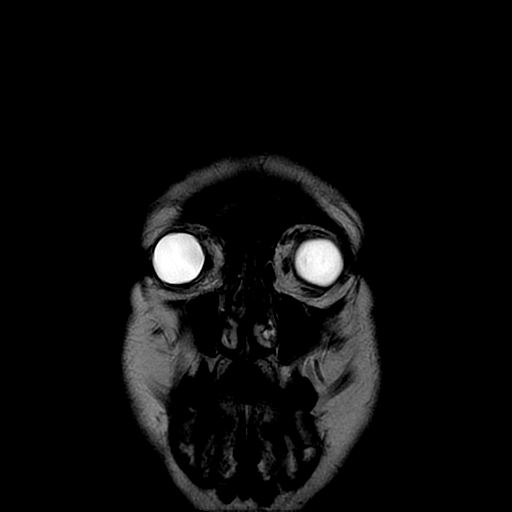

[Series 11: FLAIR · coronal · 5.0mm · 0.47mm/px · 2 of 27 slices shown (3 of 4)]
[im 1/27]
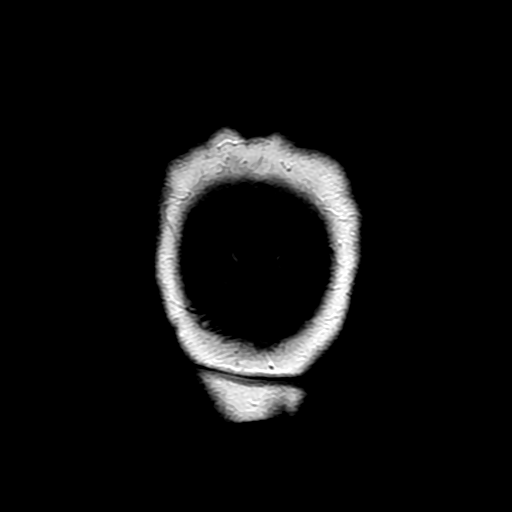
[im 27/27]
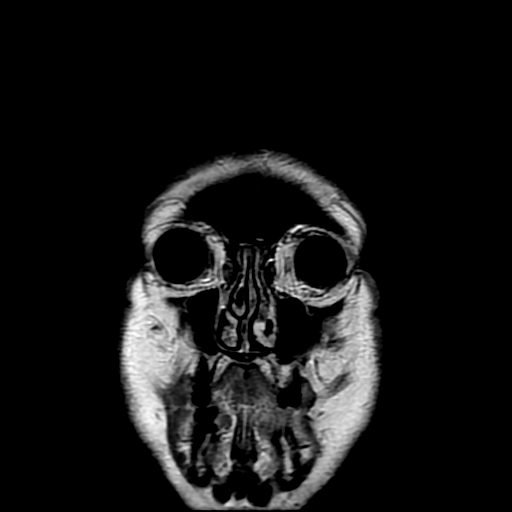

[Series 12: FLAIR · sagittal · 5.0mm · 0.47mm/px · 2 of 21 slices shown (4 of 4)]
[im 1/21]
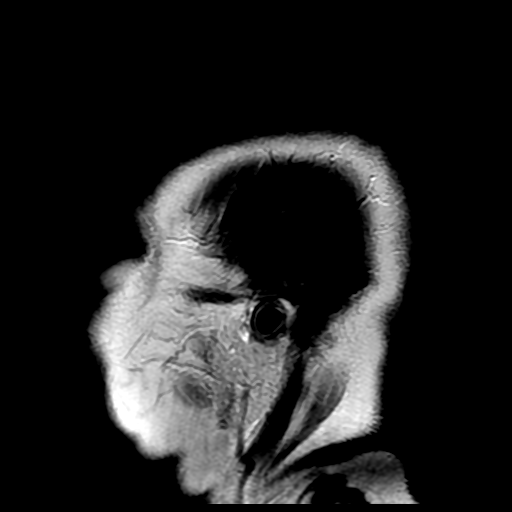
[im 21/21]
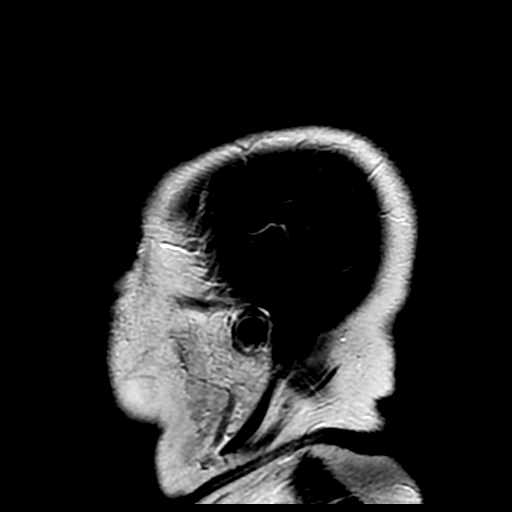

[Series 400: DWI · axial · 3.0mm · 0.94mm/px · z∈[-67,+64]mm · 2 of 34 slices shown (2 of 2)]
[im 1/34]
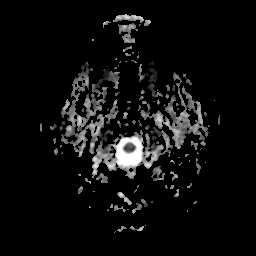
[im 34/34]
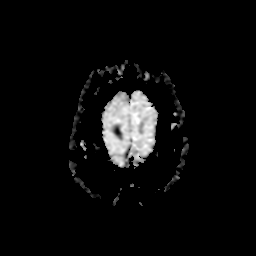

[25 of 48 positions shown; findings below may reference images not displayed]

Canned report from images found in remote index.

Refer to host system for actual result text.

## 2017-12-14 ENCOUNTER — Encounter: Payer: Self-pay | Admitting: Cardiology

## 2017-12-14 ENCOUNTER — Ambulatory Visit (INDEPENDENT_AMBULATORY_CARE_PROVIDER_SITE_OTHER): Payer: Medicare HMO | Admitting: Cardiology

## 2017-12-14 VITALS — BP 90/60 | HR 40 | Ht 64.0 in | Wt 207.1 lb

## 2017-12-14 DIAGNOSIS — R001 Bradycardia, unspecified: Secondary | ICD-10-CM

## 2017-12-14 DIAGNOSIS — I480 Paroxysmal atrial fibrillation: Secondary | ICD-10-CM

## 2017-12-14 DIAGNOSIS — I5032 Chronic diastolic (congestive) heart failure: Secondary | ICD-10-CM

## 2017-12-14 DIAGNOSIS — I739 Peripheral vascular disease, unspecified: Secondary | ICD-10-CM

## 2017-12-14 DIAGNOSIS — I2510 Atherosclerotic heart disease of native coronary artery without angina pectoris: Secondary | ICD-10-CM | POA: Diagnosis not present

## 2017-12-14 MED ORDER — LISINOPRIL 10 MG PO TABS: 5.0000 mg | ORAL_TABLET | Freq: Every day | ORAL | 3 refills | Status: DC

## 2017-12-14 MED ORDER — SOTALOL HCL 80 MG PO TABS: 40.0000 mg | ORAL_TABLET | Freq: Two times a day (BID) | ORAL | 3 refills | Status: AC

## 2017-12-14 NOTE — Patient Instructions (Signed)
Medication Instructions:  Your physician has recommended you make the following change in your medication:  DECREASE lisinopril 5 mg (0.5 tablet) daily DECREASE sotalol 40 mg (0.5 tablet) twice daily  Labwork: None  Testing/Procedures: You had an EKG today.   Follow-Up: Your physician wants you to follow-up in: 3 months. You will receive a reminder letter in the mail two months in advance. If you don't receive a letter, please call our office to schedule the follow-up appointment.   Any Other Special Instructions Will Be Listed Below (If Applicable).     If you need a refill on your cardiac medications before your next appointment, please call your pharmacy.

## 2017-12-14 NOTE — Progress Notes (Signed)
Cardiology Office Note:    Date:  12/14/2017   ID:  Misty Payne, Misty Payne 1946-01-07, MRN [id number]  PCP:  Aldona Bar, MD  Cardiologist:  Jenne Campus, MD    Referring MD: Aldona Bar, MD   Chief Complaint  Patient presents with  . 2 Week Follow-up  Weak and tired but overall seems to be doing well  History of Present Illness:    Misty Payne is a 72 y.o. female diastolic congestive heart failure.  Last time she was here she was complaining of having swelling of her lower extremities.  Advised to increase dose of furosemide however she did not do because she did not want to be allowed she did have a PET scan on her pulmonary lesion which showed some increased activity tomorrow she is going to have biopsy.  Denies having any tightness squeezing pressure burning chest shortness of breath is there is always however she lives a very sedentary life biggest complaint she has is pain in her back and because of this inability to walk.  She is convinced that this is related to adhesions that she got in her belly however told her this is probably vascular or orthopedic/neurological problem.  She is not interested in doing anything about it right now.  Past Medical History:  Diagnosis Date  . Cancer ([place])   . Depression   . Diabetes mellitus without complication ([place])   . GERD (gastroesophageal reflux disease)   . History of blood transfusion   . IBS (irritable bowel syndrome)   . Thyroid disease     Past Surgical History:  Procedure Laterality Date  . APPENDECTOMY    . BLADDER SUSPENSION    . CAROTID ENDARTERECTOMY    . CAROTID STENT    . RENAL ARTERY STENT    . THYROIDECTOMY    . TONSILLECTOMY AND ADENOIDECTOMY    . TOTAL ABDOMINAL HYSTERECTOMY W/ BILATERAL SALPINGOOPHORECTOMY      Current Medications: Current Meds  Medication Sig  . DOCOSAHEXAENOIC ACID PO Take 2 g by mouth daily.  Marland Kitchen ezetimibe (ZETIA) 10 MG tablet Take 10 mg by mouth daily.  Marland Kitchen  FLUoxetine (PROZAC) 20 MG capsule Take 20 mg by mouth daily.  . furosemide (LASIX) 20 MG tablet Take 20 mg by mouth daily.  Marland Kitchen glipiZIDE (GLUCOTROL) 5 MG tablet Take 10 mg by mouth 2 (two) times daily.  . insulin glargine (LANTUS) 100 UNIT/ML injection Inject 20 Units into the skin at bedtime.  . insulin lispro (HUMALOG) 100 UNIT/ML injection Inject 42 Units into the skin 2 (two) times daily.   Marland Kitchen levothyroxine (SYNTHROID, LEVOTHROID) 100 MCG tablet Take 1 tablet by mouth daily.  Marland Kitchen lisinopril (PRINIVIL,ZESTRIL) 10 MG tablet Take 10 mg by mouth daily.  . pantoprazole (PROTONIX) 40 MG tablet Take 40 mg by mouth at bedtime.  . Probiotic Product (PROBIOTIC-10 PO) Take 1 tablet by mouth daily.  . Rivaroxaban (XARELTO) 15 MG TABS tablet Take 1 tablet (15 mg total) by mouth daily.  . sotalol (BETAPACE) 80 MG tablet Take 1 tablet (80 mg total) by mouth 2 (two) times daily.     Allergies:   Gabapentin; Tetanus toxoids; Aspirin; Codeine; Dextromethorphan; Statins; Latex; Lidocaine; Morpholine salicylate; Naloxone hcl; Oxycodone-acetaminophen; Pentazocine; and Tape   Social History   Socioeconomic History  . Marital status: Legally Separated    Spouse name: Not on file  . Number of children: Not on file  . Years of education: Not on file  . Highest education level: Not  on file  Occupational History  . Not on file  Social Needs  . Financial resource strain: Not on file  . Food insecurity:    Worry: Not on file    Inability: Not on file  . Transportation needs:    Medical: Not on file    Non-medical: Not on file  Tobacco Use  . Smoking status: Former Research scientist (life sciences)  . Smokeless tobacco: Never Used  Substance and Sexual Activity  . Alcohol use: No  . Drug use: No  . Sexual activity: Not on file  Lifestyle  . Physical activity:    Days per week: Not on file    Minutes per session: Not on file  . Stress: Not on file  Relationships  . Social connections:    Talks on phone: Not on file    Gets  together: Not on file    Attends religious service: Not on file    Active member of club or organization: Not on file    Attends meetings of clubs or organizations: Not on file    Relationship status: Not on file  Other Topics Concern  . Not on file  Social History Narrative  . Not on file     Family History: The patient's family history includes Cancer in her father and mother; Heart disease in her father and mother. ROS:   Please see the history of present illness.    All 14 point review of systems negative except as described per history of present illness  EKGs/Labs/Other Studies Reviewed:      Recent Labs: 12/01/2017: BUN 15; Creatinine, Ser 0.90; NT-Pro BNP 1,103; Potassium 4.1; Sodium 144  Recent Lipid Panel    Component Value Date/Time   CHOL 217 (H) 06/30/2017 1150   TRIG 300 (H) 06/30/2017 1150   HDL 35 (L) 06/30/2017 1150   CHOLHDL 6.2 (H) 06/30/2017 1150   LDLCALC 122 (H) 06/30/2017 1150    Physical Exam:    VS:  BP 90/60   Pulse (!) 40   Ht 5\' 4"  (1.626 m)   Wt 207 lb 1.9 oz (93.9 kg)   SpO2 97% Comment: 2lt of O2  BMI 35.55 kg/m     Wt Readings from Last 3 Encounters:  12/14/17 207 lb 1.9 oz (93.9 kg)  12/01/17 214 lb (97.1 kg)  06/30/17 193 lb (87.5 kg)     GEN:  Well nourished, well developed in no acute distress HEENT: Normal NECK: No JVD; No carotid bruits LYMPHATICS: No lymphadenopathy CARDIAC: RRR, no murmurs, no rubs, no gallops RESPIRATORY:  Clear to auscultation without rales, wheezing or rhonchi  ABDOMEN: Soft, non-tender, non-distended MUSCULOSKELETAL:  No edema; No deformity  SKIN: Warm and dry LOWER EXTREMITIES: no swelling NEUROLOGIC:  Alert and oriented x 3 PSYCHIATRIC:  Normal affect   ASSESSMENT:    1. Coronary artery disease involving native coronary artery of native heart without angina pectoris   2. Chronic diastolic congestive heart failure, NYHA class 3 (HCC)   3. Paroxysmal atrial fibrillation ([place])   4.  Bradycardia   5. Claudication in peripheral vascular disease ([place])    PLAN:    In order of problems listed above:  1. Coronary artery disease: Stable and appropriate medications which I will continue. 2. Chronic diastolic congestive heart failure: Appears to be compensated we will continue present management.  I told her to increase dose of furosemide from 20-40 only on the day that she will have increased swelling of her lower extremities I am worried about  her blood pressure being low 3. Paroxysmal atrial fibrillation: Successfully control with sotalol.  Her heart rate very slow today.  I will ask her to have EKG done we may be forced to reduce dose of sotalol. 4. Bradycardia: Plan as outlined above we will do EKG and then decide about sotalol dose.  Likely no passing out 5. Claudications with peripheral vascular disease: She does not want anything to be done about that right now.  This is something we will continue to discuss with her. 6. Relative hypotension.  I will ask her to reduce dose of lisinopril from 10-5  EKG shows sinus bradycardia down to 40.  I will reduce her sotalol from 80 to 40 mg twice daily   Medication Adjustments/Labs and Tests Ordered: Current medicines are reviewed at length with the patient today.  Concerns regarding medicines are outlined above.  No orders of the defined types were placed in this encounter.  Medication changes: No orders of the defined types were placed in this encounter.   Signed, Park Liter, MD, [facility] 12/14/2017 9:58 AM    [place]

## 2017-12-15 ENCOUNTER — Ambulatory Visit: Payer: Medicare HMO | Admitting: Cardiology

## 2017-12-22 IMAGING — CT CT OUTSIDE FILMS CHEST
4 of 7 series · 11 of 30 positions shown, 13 images · non-contrast
Comparison: none

[Series 2: pe 1.25 · axial · 0.75mm/px · z∈[-230,-62]mm · 5 of 224 slices shown, 7 images]
[im 45/224  mediastinal]
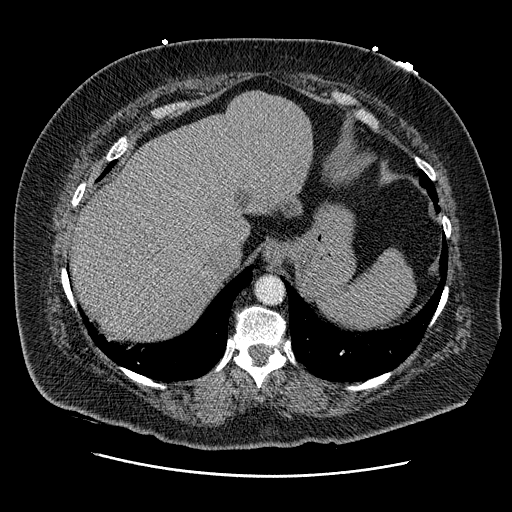
[im 45/224  lung]
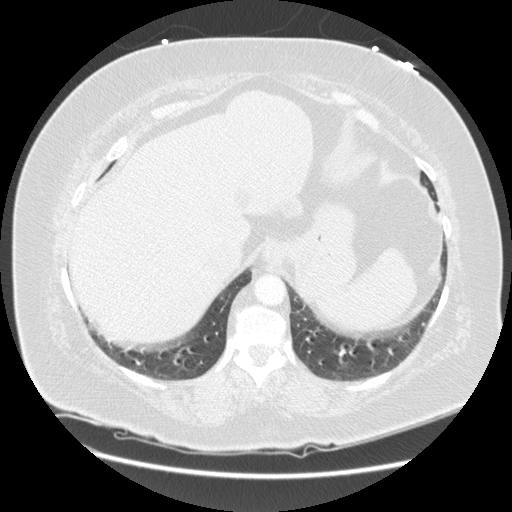
[im 90/224  lung]
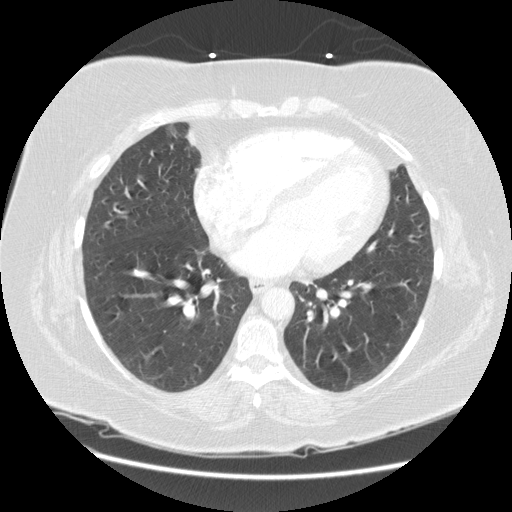
[im 127/224  lung]
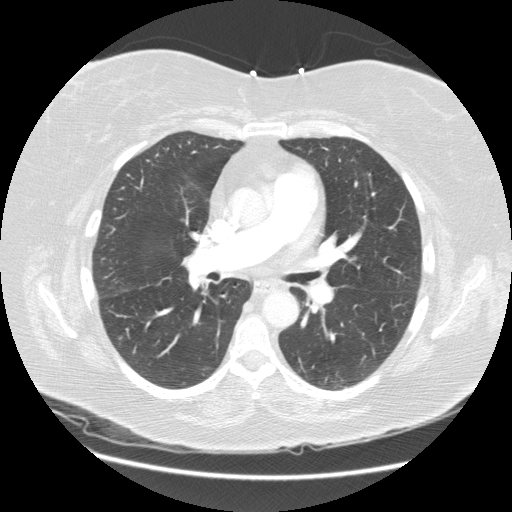
[im 134/224  lung]
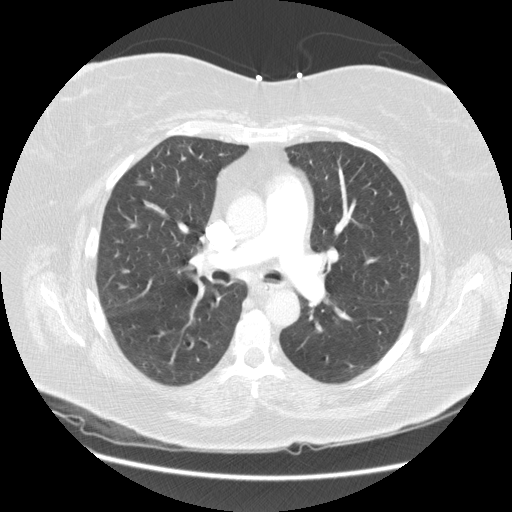
[im 179/224  mediastinal]
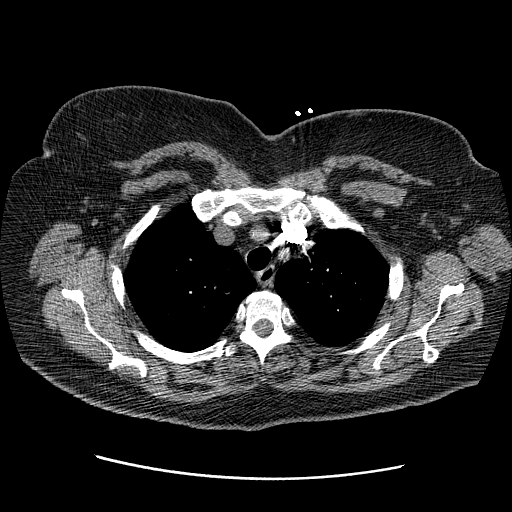
[im 179/224  lung]
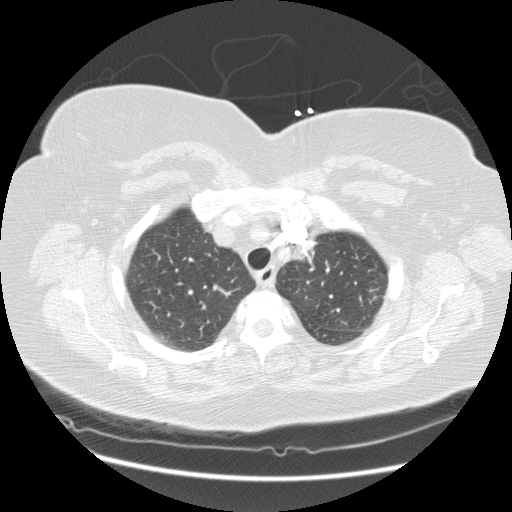

[Series 3: pe 2.5 · axial · 0.75mm/px · z∈[-146,-126]mm · 2 of 112 slices shown]
[im 56/112  lung]
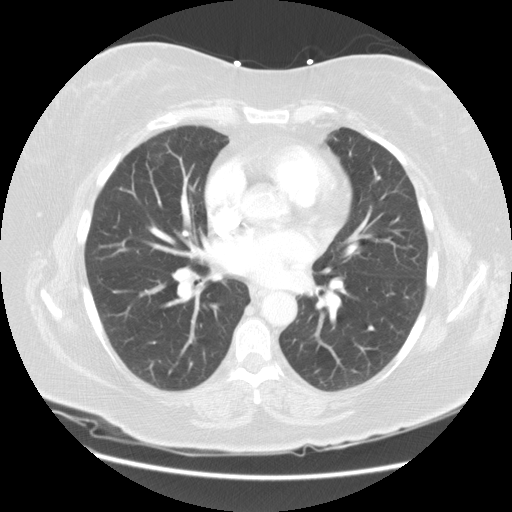
[im 64/112  lung]
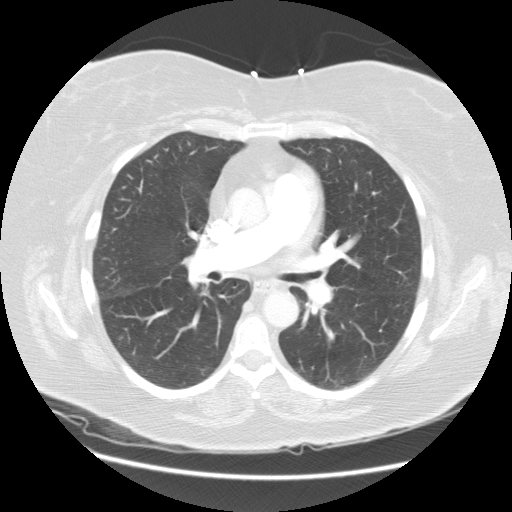

[Series 603: cor 2 5x2 5 bf · coronal · 0.76mm/px · 2 of 131 slices shown]
[im 44/131  lung]
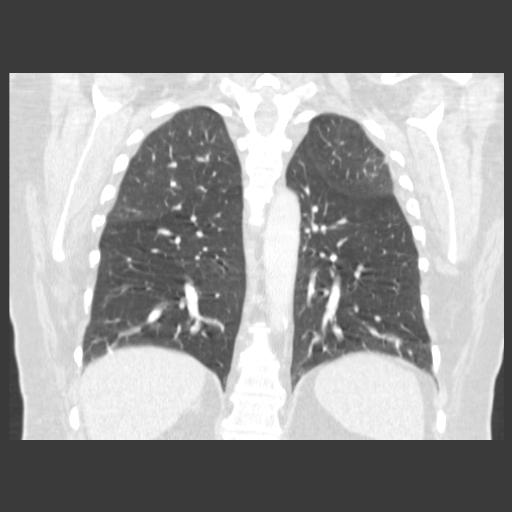
[im 87/131  lung]
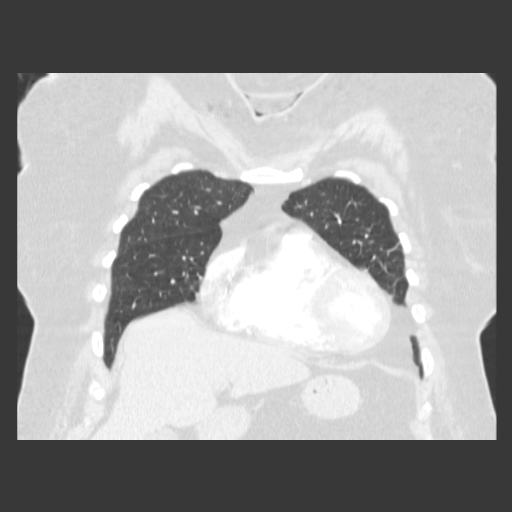

[Series 604: sag 2 5x2 5 · sagittal · 0.75mm/px · 2 of 155 slices shown]
[im 52/155  lung]
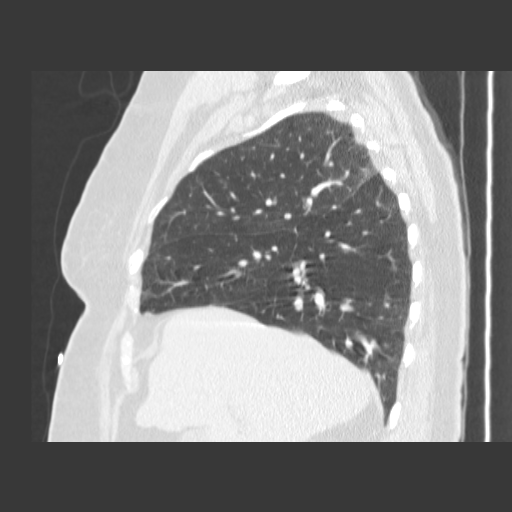
[im 103/155  lung]
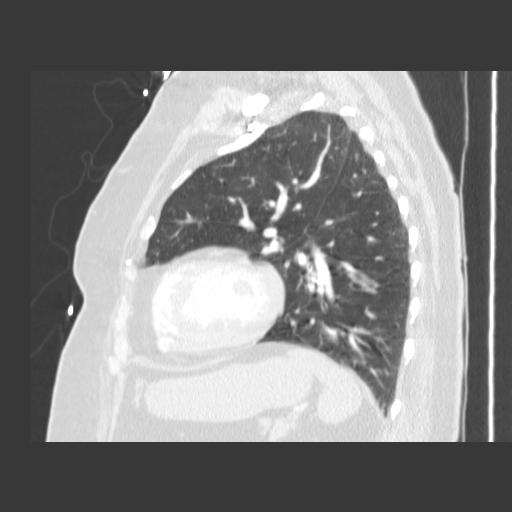

[11 of 30 positions shown; findings below may reference images not displayed]

Canned report from images found in remote index.

Refer to host system for actual result text.

## 2017-12-22 IMAGING — CR DG OUTSIDE FILMS CHEST
1 series · 1 of 1 positions shown · non-contrast
Comparison: none

[AP]
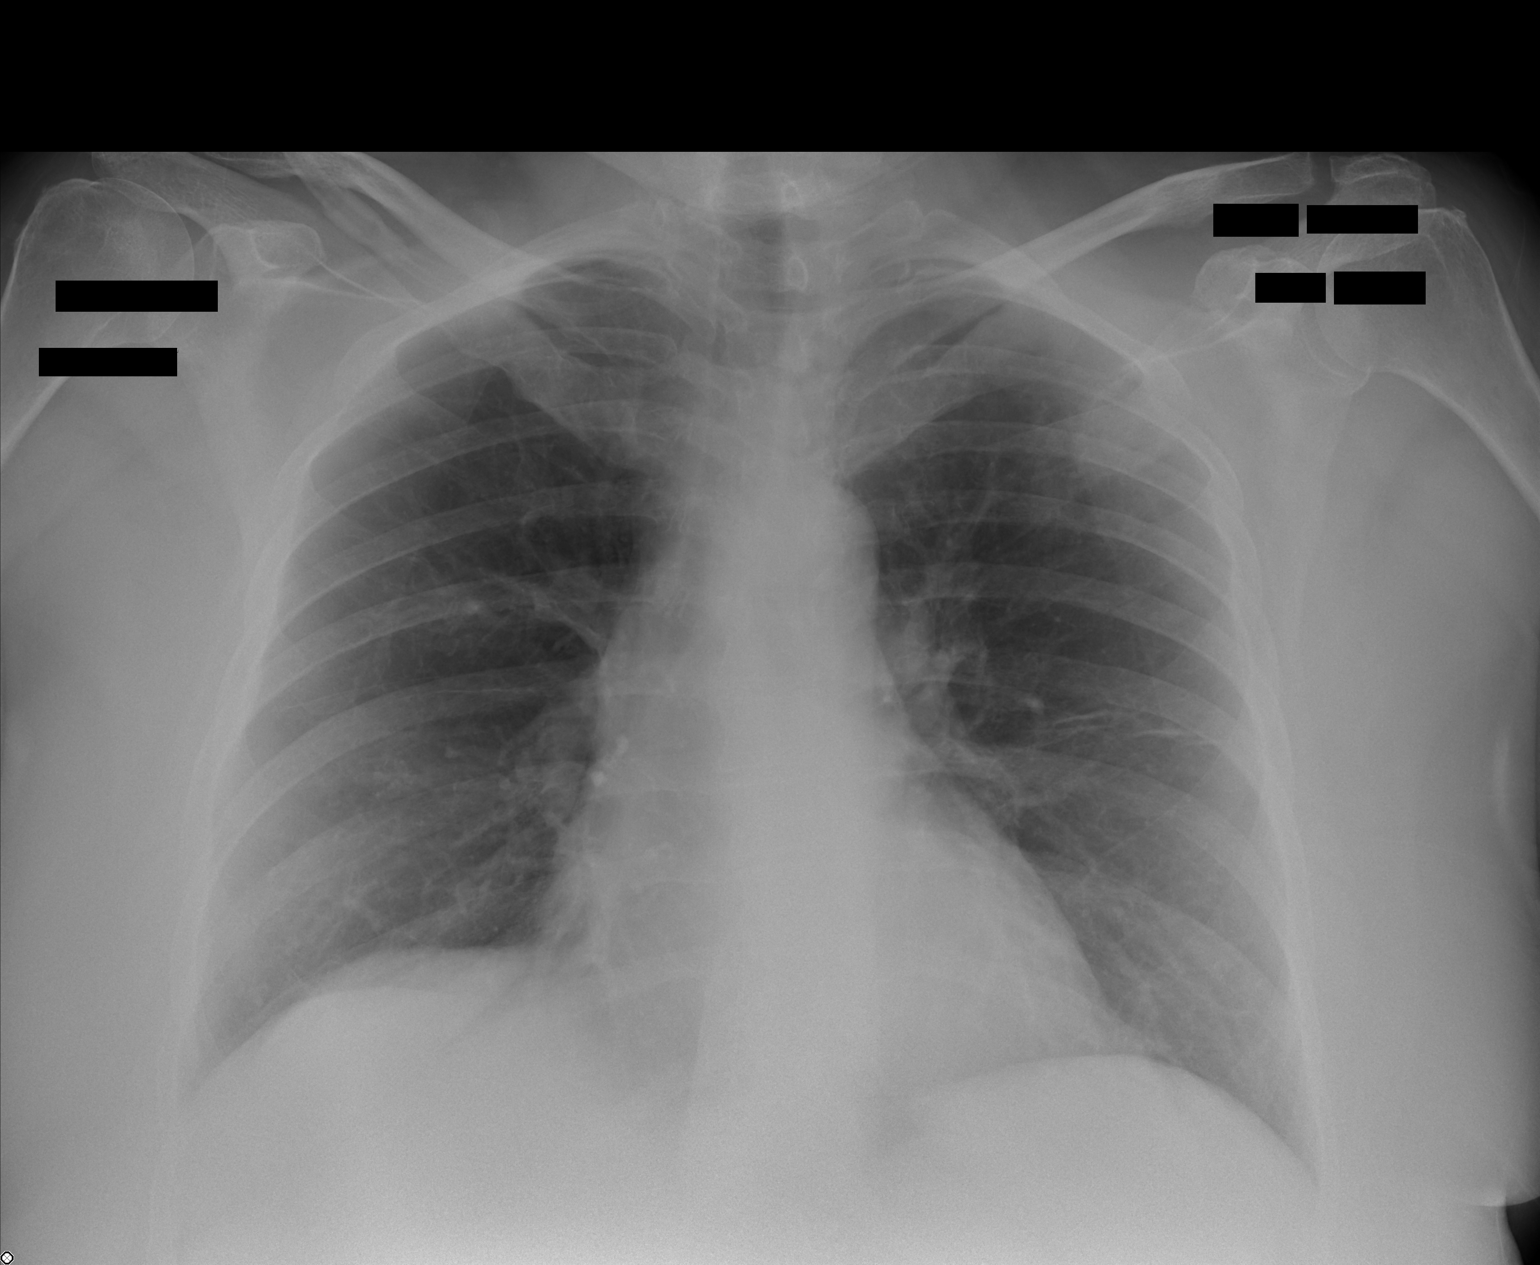

[1 of 1 positions shown; findings below may reference images not displayed]

Canned report from images found in remote index.

Refer to host system for actual result text.

## 2017-12-28 DIAGNOSIS — C3412 Malignant neoplasm of upper lobe, left bronchus or lung: Secondary | ICD-10-CM | POA: Diagnosis not present

## 2018-01-01 ENCOUNTER — Encounter: Payer: Self-pay | Admitting: *Deleted

## 2018-01-01 ENCOUNTER — Encounter: Payer: Self-pay | Admitting: Oncology

## 2018-01-01 ENCOUNTER — Encounter: Payer: Medicare HMO | Admitting: Cardiothoracic Surgery

## 2018-01-01 LAB — PULMONARY FUNCTION TEST

## 2018-01-06 ENCOUNTER — Other Ambulatory Visit: Payer: Self-pay

## 2018-01-06 ENCOUNTER — Institutional Professional Consult (permissible substitution) (INDEPENDENT_AMBULATORY_CARE_PROVIDER_SITE_OTHER): Payer: Medicare HMO | Admitting: Cardiothoracic Surgery

## 2018-01-06 ENCOUNTER — Encounter: Payer: Self-pay | Admitting: Cardiothoracic Surgery

## 2018-01-06 VITALS — BP 116/62 | HR 59 | Resp 18 | Ht 64.0 in | Wt 214.0 lb

## 2018-01-06 DIAGNOSIS — C3490 Malignant neoplasm of unspecified part of unspecified bronchus or lung: Secondary | ICD-10-CM | POA: Diagnosis not present

## 2018-01-06 NOTE — Progress Notes (Signed)
[address]       [place] [address]             [phone]                    Amonie Maureen Dufresne McKinney Medical Record #[id number] Date of Birth: 1946-03-21  Referring: Marice Potter, MD Primary Care: Aldona Bar, MD Primary Cardiologist: Jenne Campus, MD  Chief Complaint:    Chief Complaint  Patient presents with  . Lung Cancer    new patient, PET 12/22/2017, PFTs 12/31/2017    History of Present Illness:    Misty Payne 72 y.o. female is seen in the office  today for evaluation of a left lung mass.  In early April 2019 the patient noted ending increased shortness of breath and abdominal pain . She was seen in the [facility]  emergency room for further evaluation.  An initial chest x-ray was clear she then underwent a CT angios to rule out pulmonary embolus.  This study incidentally showed a 2.3 x 1.5 cm left upper lobe lung mass worrisome for primary bronchogenic carcinoma.  Patient was hospitalized and ultimately underwent cholecystectomy for gangrenous gallbladder.  Since that time she is been it home on home oxygen.,  With very limited mobility she currently comes to the office in a wheelchair with home oxygen.   In follow-up after her recent hospitalization a PET scan was done a, there  was no evidence of distant or regional disease.  A core needle biopsy was done at [facility], pathology report notes small cell carcinoma.  This report was also reviewed by pathologist at [facility] during the multidisciplinary thoracic oncology conference last  week.  Patient denies weight loss she has a long term smoker, at least daily for 50 years  She has a history of thyroid carcinoma cervical cancer, atrial fibrillation coronary artery disease and hypertension  Previous surgery includes cholecystectomy, hysterectomy total thyroidectomy appendectomy and bilateral cataract surgery In 2013 patient underwent combined total thyroidectomy and  right carotid endarterectomy at [facility].  She notes that her thyroid was found to be malignant but she had no further treatment.    Current Activity/ Functional Status:  Patient is independent with mobility/ambulation, transfers, ADL's, IADL's.   Zubrod Score: At the time of surgery this patient's most appropriate activity status/level should be described as: []     0    Normal activity, no symptoms []     1    Restricted in physical strenuous activity but ambulatory, able to do out light work [x]     2    Ambulatory and capable of self care, unable to do work activities, up and about               >50 % of waking hours                              []     3    Only limited self care, in bed greater than 50% of waking hours []     4    Completely disabled, no self care, confined to bed or chair []     5    Moribund   Past Medical History:  Diagnosis Date  . Cancer ([place])   . Depression   . Diabetes mellitus without complication ([place])   . GERD (gastroesophageal reflux disease)   . History of blood transfusion   .  IBS (irritable bowel syndrome)   . Thyroid disease     Past Surgical History:  Procedure Laterality Date  . APPENDECTOMY    . BLADDER SUSPENSION    . CAROTID ENDARTERECTOMY    . CAROTID STENT    . RENAL ARTERY STENT    . THYROIDECTOMY    . TONSILLECTOMY AND ADENOIDECTOMY    . TOTAL ABDOMINAL HYSTERECTOMY W/ BILATERAL SALPINGOOPHORECTOMY     At [facility] : Procedure: THYROIDECTOMY TOTAL; Surgeon: Fredirick Maudlin, MD; Location: [facility] MAIN OR; Service: General; Laterality: N/A; Right Carotid endarterectomy with Left Saphenous Vein Graft by Dr Oletta Lamas  . Carotid endarterectomy 09/16/2011  Procedure: CAROTID ENDARTERECTOMY; Surgeon: Larkin Ina, MD; Location: [facility] MAIN OR; Service: Vascular; Laterality: N/A;      Family History  Problem Relation Age of Onset  . Cancer Mother   . Heart disease Mother   . Cancer Father   . Heart disease Father   Family history is  significant for her mother died at age 17 of bladder cancer her father died at age 59 with metastatic lung cancer she has 2 brothers one with peripheral artery disease 6 sisters 2 who have peripheral artery disease one sister died of a motor vehicle accident, patient has 2 daughters one diagnosed with cervical cancer at age 9 another diagnosed with uterine cancer at age 45   Social History   Tobacco Use  Smoking Status Former Smoker  Smokeless Tobacco Never Used    Social History   Substance and Sexual Activity  Alcohol Use No     Allergies  Allergen Reactions  . Gabapentin Other (See Comments)    Dizziness and rapid heart rate  . Tetanus Toxoids Swelling  . Aspirin Palpitations  . Codeine Palpitations  . Dextromethorphan Palpitations  . Statins     Muscle pain/weakness  . Latex Rash  . Lidocaine Palpitations  . Morpholine Salicylate Itching  . Naloxone Hcl Itching  . Oxycodone-Acetaminophen Itching  . Pentazocine Itching  . Tape Itching    Current Outpatient Medications  Medication Sig Dispense Refill  . calcium carbonate (TUMS - DOSED IN MG ELEMENTAL CALCIUM) 500 MG chewable tablet Chew 1 tablet by mouth 2 (two) times daily.    . DOCOSAHEXAENOIC ACID PO Take 2 g by mouth daily.    Marland Kitchen FLUoxetine (PROZAC) 20 MG capsule Take 20 mg by mouth daily.    . furosemide (LASIX) 20 MG tablet Take 20 mg by mouth daily.    Marland Kitchen glipiZIDE (GLUCOTROL) 5 MG tablet Take 10 mg by mouth 2 (two) times daily.    . insulin glargine (LANTUS) 100 UNIT/ML injection Inject 20 Units into the skin at bedtime.    . insulin lispro (HUMALOG) 100 UNIT/ML injection Inject 42 Units into the skin 2 (two) times daily.     Marland Kitchen levothyroxine (SYNTHROID, LEVOTHROID) 100 MCG tablet Take 1 tablet by mouth daily.    Marland Kitchen lisinopril (PRINIVIL,ZESTRIL) 10 MG tablet Take 5 mg by mouth daily.     . Omega-3 Fatty Acids (FISH OIL PO) Take 2,000 mg by mouth 2 (two) times daily.    . pantoprazole (PROTONIX) 40 MG tablet Take  40 mg by mouth at bedtime.    . Probiotic Product (PROBIOTIC-10 PO) Take 1 tablet by mouth daily.    . Rivaroxaban (XARELTO) 15 MG TABS tablet Take 1 tablet (15 mg total) by mouth daily. 90 tablet 2  . sotalol (BETAPACE) 80 MG tablet Take 0.5 tablets (40 mg total) by mouth 2 (two) times daily.  90 tablet 3   No current facility-administered medications for this visit.       Review of Systems:     Cardiac Review of Systems: [Y] = yes  or   [ N ] = no   Chest Pain [  y  ]  Resting SOB Blue.Reese   ] Exertional SOB  [ y ]  Vertell Limber Blue.Reese  ]   Pedal Edema [ y  ]    Palpitations Blue.Reese  ] Syncope  [  n]   Presyncope [ n  ]   General Review of Systems: [Y] = yes [  ]=no Constitional: recent weight change [  ];  Wt loss over the last 3 months [ y  ] anorexia [  ]; fatigue [ y ]; nausea [  ]; night sweats [  ]; fever [  ]; or chills [  ];           Eye : blurred vision [  ]; diplopia [   ]; vision changes [  ];  Amaurosis fugax[  ]; Resp: cough Blue.Reese  ];  wheezing[y  ];  hemoptysis[  ]; shortness of breath[ y ]; paroxysmal nocturnal dyspnea[  ]; dyspnea on exertion[ y ]; or orthopnea[  ];  GI:  gallstones[  ], vomiting[  ];  dysphagia[  ]; melena[  ];  hematochezia [  ]; heartburn[  ];   Hx of  Colonoscopy[  ]; GU: kidney stones [  ]; hematuria[  ];   dysuria [  ];  nocturia[  ];  history of     obstruction [  ]; urinary frequency [  ]             Skin: rash, swelling[  ];, hair loss[  ];  peripheral edema[  ];  or itching[  ]; Musculosketetal: myalgias[  ];  joint swelling[  ];  joint erythema[  ];  joint pain[  ];  back pain[  ];  Heme/Lymph: bruising[  ];  bleeding[  ];  anemia[  ];  Neuro: TIA[  ];  headaches[  ];  stroke[  ];  vertigo[  ];  seizures[  ];   paresthesias[  ];  difficulty walking[ y ];  Psych:depression[  ]; anxiety[  ];  Endocrine: diabetes[y  ];  thyroid dysfunction[  ];  Immunizations: Flu up to date [  ]; Pneumococcal up to date [  ];    PHYSICAL EXAMINATION: BP 116/62 (BP Location: Left  Arm, Patient Position: Sitting, Cuff Size: Normal)   Pulse (!) 59   Resp 18   Ht 5\' 4"  (1.626 m)   Wt 214 lb (97.1 kg)   SpO2 94% Comment: 2L [place]  BMI 36.73 kg/m  General appearance: alert, cooperative, appears older than stated age and Patient rolled into the office in a wheelchair was unable to ambulate well, carrying a bottle of home oxygen Head: Normocephalic, without obvious abnormality, atraumatic Neck: no adenopathy, no carotid bruit, no JVD, supple, symmetrical, trachea midline and There is no palpable enlargement in the neck or thyroid ectomy carotid endarterectomy incision Lymph nodes: Cervical, supraclavicular, and axillary nodes normal. Resp: diminished breath sounds bibasilar Back: symmetric, no curvature. ROM normal. No CVA tenderness. Cardio: regular rate and rhythm, S1, S2 normal, no murmur, click, rub or gallop GI: soft, non-tender; bowel sounds normal; no masses,  no organomegaly and Sites of recent cholecystectomy are well-healed without abdominal tenderness Extremities: edema Mild bilateral pedal edema and Homans sign is negative, no sign  of DVT Neurologic: Grossly normal  Diagnostic Studies & Laboratory data:     Recent Radiology Findings:  CLINICAL DATA: Right-sided chest pain shortness of breath.  EXAM: CT ANGIOGRAPHY CHEST WITH CONTRAST  TECHNIQUE: Multidetector CT imaging of the chest was performed using the standard protocol during bolus administration of intravenous contrast. Multiplanar CT image reconstructions and MIPs were obtained to evaluate the vascular anatomy.  CONTRAST: 80 mL iopamidol (ISOVUE-300) 61 % injection  COMPARISON: Chest CT 05/09/2014  FINDINGS: Cardiovascular: Contrast injection is sufficient to demonstrate satisfactory opacification of the pulmonary arteries to the segmental level. There is no pulmonary embolus. The main pulmonary artery is within normal limits for size. There is a normal 3-vessel arch branching pattern without  evidence of acute aortic syndrome. There is mildaortic atherosclerosis. Heart size is normal, without pericardial effusion. There are coronary artery calcifications.  Mediastinum/Nodes: No mediastinal, hilar or axillary lymphadenopathy. The visualized thyroid and thoracic esophageal course are unremarkable.  Lungs/Pleura: There is a left upper lobe nodule measuring 2.3 x 1.5 cm. No pleural effusion.  Upper Abdomen: Contrast bolus timing is not optimized for evaluation of the abdominal organs. 1.6 cm hypodensity in the right hepatic lobe and 1.3 cm hypodensity in the left hepatic lobe are both unchanged from 05/09/2014.  Musculoskeletal: No chest wall abnormality. No acute or significant osseous findings.  Review of the MIP images confirms the above findings.  IMPRESSION: 1. No pulmonary embolus. 2. 2.3 cm left upper lobe pulmonary nodule is concerning for pulmonary metastatic disease in the context of history of thyroid cancer. A primary pulmonary malignancy such as adenocarcinoma is a secondary consideration. 3. Aortic Atherosclerosis (ICD10-I70.0). 4. Hypodense foci in the liver are unchanged compared to 05/09/2014 and likely a patent cysts or other benign lesions.   Electronically Signed By: Ulyses Jarred M.D. On: 11/20/2017 04:36     CLINICAL DATA: Initial treatment strategy for lung cancer.  EXAM: NUCLEAR MEDICINE PET SKULL BASE TO THIGH  TECHNIQUE: 13.7 mCi F-18 FDG was injected intravenously. Full-ring PET imaging was performed from the skull base to thigh after the radiotracer. CT data was obtained and used for attenuation correction and anatomic localization.  Fasting blood glucose: 202 mg/dl  COMPARISON: 11/20/2017  FINDINGS: Mediastinal blood pool activity: SUV max 3.5  NECK: No hypermetabolic lymph nodes in the neck.  Incidental CT findings: none  CHEST: No hypermetabolic mediastinal or hilar lymph nodes.  Solid left upper lobe lung nodule  measures 2.3 cm and has an SUV max equal to 6.9. Adjacent areas of ground-glass attenuation persist, image 81/3 and image 78/3.  Incidental CT findings: Aortic atherosclerosis. Calcifications within the RCA and LAD coronary artery noted. Trace right pleural effusion identified.  ABDOMEN/PELVIS: No abnormal hypermetabolic activity within the liver, pancreas, adrenal glands, or spleen. No hypermetabolic lymph nodes in the abdomen or pelvis.  Incidental CT findings: Previous cholecystectomy. 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 Aortic atherosclerosis. No aneurysm.  SKELETON: No focal hypermetabolic activity to suggest skeletal metastasis.  Incidental CT findings: none  IMPRESSION: 1. Moderate to marked increased uptake associated with the left upper lobe pulmonary nodule identified which is suspicious for primary pulmonary neoplasm. No findings to suggest hypermetabolic nodal metastasis within the chest or evidence of distant metastatic disease. 2. Aortic Atherosclerosis (ICD10-I70.0). RCA and LAD coronary artery calcifications noted. 3. Left kidney AML.   Electronically Signed By: Kerby Moors M.D. On: 12/02/2017 12:46  CLINICAL DATA: Hypermetabolic left upper lobe pulmonary nodule concerning for lung cancer. Evaluation for metastatic disease.  Right eye blurred vision for 1 month. Unsteady gait for 4 years.  EXAM: MRI HEAD WITHOUT AND WITH CONTRAST  TECHNIQUE: Multiplanar, multiecho pulse sequences of the brain and surrounding structures were obtained without and with intravenous contrast.  CONTRAST: 20 mL MultiHance  COMPARISON: Head CT 05/09/2014  FINDINGS: Brain: There is no evidence of acute infarct, intracranial hemorrhage, mass, midline shift, or extra-axial fluid collection. The ventricles and sulci are within normal limits for age. No significant cerebral white matter disease is seen. No abnormal enhancement is identified.  Vascular: Major  intracranial vascular flow voids are preserved.  Skull and upper cervical spine: Unremarkable bone marrow signal.  Sinuses/Orbits: Bilateral cataract extraction. Paranasal sinuses and mastoid air cells are clear.  Other: None.  IMPRESSION: Unremarkable appearance of the brain for age. No evidence of metastatic disease.   Electronically Signed By: Logan Bores M.D. On: 12/08/2017 13:22  I have independently reviewed the above radiology studies  and reviewed the findings with the patient.   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  We discussed the alternative treatment of stereotactic radiotherapy to the left lung mass.  Since the biopsy suggested small cell carcinoma had an chemotherapy was also discussed with her.  She did not wish to have chemo but will further discuss this with Dr. Bobby Rumpf #2 severe underlying pulmonary disease, on home oxygen and with limited mobility #3 history of thyroid carcinoma, stage unknown but no systemic treatment undertaken #4 peripheral vascular disease #5 chronic anticoagulation for atrial fibrillation   I reviewed the patient's  CT and PET scan and path results with her and explained the diagnosis.  She will be referred to the radiation oncologist for consideration of stereotactic radiotherapy to this 2 cm left upper lobe lung mass.  She will follow-up with Dr. Bobby Rumpf administration of chemotherapy followed following radiation therapy because of the mass origin being small cell lung cancer.    I  spent 60 minutes with  the patient face to face and greater then 50% of the time was spent in counseling and coordination of care.    Grace Isaac MD      [address] [place] [address] Office [phone]   Beeper [phone]  01/06/2018 3:54 PM

## 2018-01-13 ENCOUNTER — Telehealth: Payer: Self-pay | Admitting: Radiation Oncology

## 2018-01-13 NOTE — Telephone Encounter (Signed)
I tried to call pt to move up appt if she wishes but could not reach her and her VM box is full.

## 2018-01-14 NOTE — Progress Notes (Signed)
Thoracic Location of Tumor / Histology: Small cell carcinoma of Left upper lobe  Patient presented with increasing shortness of breath and abdominal pain.  Chest xray: negative  CTA: incidental finding of a 2.3 x 1.5 cm left upper lobe lung mass worrisome for primary bronchogenic carcinoma.  Patient was hospitalized and underwent a cholecystectomy for gangrenous gallbladder.  PET 12/02/2017: 1. Moderate to marked increased uptake associated with the left upper lobe pulmonary nodule identified which is suspicious for primary pulmonary neoplasm. No findings to suggest hypermetabolic nodal metastasis within the chest or evidence of distant metastatic disease.  MRI 12/08/2017: Unremarkable appearance of the brain for age.  No evidence of metastatic disease.  Biopsy done at [facility] 12/22/2017    Tobacco/Marijuana/Snuff/ETOH use: former smoker  Past/Anticipated interventions by cardiothoracic surgery, if any:  Dr. Servando Snare 01/06/2018 1.  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  We discussed the alternative treatment of stereotactic radiotherapy to the left lung mass.  Since the biopsy suggested small cell carcinoma had an chemotherapy was also discussed with her.  She did not wish to have chemo but will further discuss this with Dr. Bobby Rumpf.   Past/Anticipated interventions by medical oncology, if any:   Signs/Symptoms  Weight changes, if any: No  Respiratory complaints, if any: None  Hemoptysis, if any: none  Pain issues, if any:  Lower extremities.  BP (!) 118/40 (BP Location: Left Arm, Patient Position: Sitting, Cuff Size: Large)   Pulse (!) 45   Temp 98.1 F (36.7 C) (Oral)    Resp 20   Ht 5\' 2"  (1.575 m)   Wt 212 lb (96.2 kg)   SpO2 93%   BMI 38.78 kg/m    Wt Readings from Last 3 Encounters:  01/19/18 212 lb (96.2 kg)  01/06/18 214 lb (97.1 kg)  12/14/17 207 lb 1.9 oz (93.9 kg)   SAFETY ISSUES:  Prior radiation? No  Pacemaker/ICD? No  Possible current pregnancy?  No  Is the patient on methotrexate? No  Current Complaints / other details:

## 2018-01-19 ENCOUNTER — Ambulatory Visit
Admission: RE | Admit: 2018-01-19 | Discharge: 2018-01-19 | Disposition: A | Discharge status: Home / Self Care | Payer: Medicare HMO | Source: Ambulatory Visit | Attending: Radiation Oncology | Admitting: Radiation Oncology

## 2018-01-19 ENCOUNTER — Ambulatory Visit
Admission: RE | Admit: 2018-01-19 | Discharge: 2018-01-19 | Disposition: A | Discharge status: Home / Self Care | Payer: Self-pay | Source: Ambulatory Visit | Attending: Radiation Oncology | Admitting: Radiation Oncology

## 2018-01-19 ENCOUNTER — Other Ambulatory Visit: Payer: Self-pay | Admitting: Radiation Oncology

## 2018-01-19 ENCOUNTER — Encounter: Payer: Self-pay | Admitting: Radiation Oncology

## 2018-01-19 ENCOUNTER — Other Ambulatory Visit: Payer: Self-pay

## 2018-01-19 VITALS — BP 118/40 | HR 45 | Temp 98.1°F | Resp 20 | Ht 62.0 in | Wt 212.0 lb

## 2018-01-19 DIAGNOSIS — Z794 Long term (current) use of insulin: Secondary | ICD-10-CM | POA: Insufficient documentation

## 2018-01-19 DIAGNOSIS — K219 Gastro-esophageal reflux disease without esophagitis: Secondary | ICD-10-CM | POA: Insufficient documentation

## 2018-01-19 DIAGNOSIS — E119 Type 2 diabetes mellitus without complications: Secondary | ICD-10-CM | POA: Insufficient documentation

## 2018-01-19 DIAGNOSIS — Z79899 Other long term (current) drug therapy: Secondary | ICD-10-CM | POA: Diagnosis not present

## 2018-01-19 DIAGNOSIS — C3412 Malignant neoplasm of upper lobe, left bronchus or lung: Secondary | ICD-10-CM

## 2018-01-19 DIAGNOSIS — Z7984 Long term (current) use of oral hypoglycemic drugs: Secondary | ICD-10-CM | POA: Diagnosis not present

## 2018-01-19 DIAGNOSIS — Z510 Encounter for antineoplastic radiation therapy: Secondary | ICD-10-CM | POA: Insufficient documentation

## 2018-01-19 DIAGNOSIS — C3490 Malignant neoplasm of unspecified part of unspecified bronchus or lung: Secondary | ICD-10-CM

## 2018-01-19 DIAGNOSIS — I4891 Unspecified atrial fibrillation: Secondary | ICD-10-CM | POA: Diagnosis not present

## 2018-01-19 DIAGNOSIS — Z87891 Personal history of nicotine dependence: Secondary | ICD-10-CM | POA: Diagnosis not present

## 2018-01-19 DIAGNOSIS — Z85850 Personal history of malignant neoplasm of thyroid: Secondary | ICD-10-CM | POA: Diagnosis not present

## 2018-01-19 DIAGNOSIS — J449 Chronic obstructive pulmonary disease, unspecified: Secondary | ICD-10-CM | POA: Diagnosis not present

## 2018-01-19 HISTORY — DX: Malignant neoplasm of thyroid gland: C73

## 2018-01-19 NOTE — Progress Notes (Signed)
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 A diagnosis of Small cell lung cancer, left upper lobe (HCC) was also pertinent to this visit. Stage I small cell lung cancer  HISTORY OF PRESENT ILLNESS: Misty Payne is a 72 y.o. female seen at the request of Dr. Servando Snare in [place] surgery. The patient noted increased SOB and abdominal pain beginning April 2019. CT angio in ED to r/o pulmonary embolus showed incidental 2.3 x 1.5 cm left upper lobe lung mass. The patient was hospitalized and eventually underwent cholecystectomy for gangrenous gallbladder. She has since been home on oxygen via nasal canula (only at night) with very limited mobility.  PET scan on 12/02/17 confirmed a 2.3 cm, solid appearing, hypermetabolic LUL nodule with SUV max of 6.9 but showed no evidence of metastasis or lymphadenopathy. The patient proceeded to CT guided core needle biopsy on 12/22/17 at [facility] with pathology returning small cell carcinoma.    MRA head on 12/08/2017 showed no evidence of metastatic disease.  Dr. Servando Snare does not feel that the patient is a good surgical candidate given her underlying COPD, poor functional status and poor performance on recent pulmonary function studies. As a result, he has referred her to radiation oncology to discuss radiation as a potential option in the management of her cancer.  She has also met with Dr. Lavera Guise in medical oncology at [facility] to discuss the role of chemotherapy in her disease management but patient has declined concurrent chemoradiotherapy and instead prefers to complete radiotherapy and then give further  consideration to possible adjuvant chemo pending response to XRT.  PREVIOUS RADIATION THERAPY: No   PAST MEDICAL HISTORY:  Past Medical History:  Diagnosis Date  . Cancer ([place])   . Depression   . Diabetes mellitus without complication ([place])   . GERD (gastroesophageal reflux disease)   . History of blood transfusion   . IBS (irritable bowel syndrome)   . Thyroid cancer ([place])        PAST SURGICAL HISTORY: Past Surgical History:  Procedure Laterality Date  . APPENDECTOMY    . BLADDER SUSPENSION    . CAROTID ENDARTERECTOMY    . CAROTID STENT Right   . RENAL ARTERY STENT Left   . THYROIDECTOMY    . TONSILLECTOMY AND ADENOIDECTOMY    . TOTAL ABDOMINAL HYSTERECTOMY W/ BILATERAL SALPINGOOPHORECTOMY       FAMILY HISTORY:  Family History  Problem Relation Age of Onset  . Cancer Mother   . Heart disease Mother   . Cancer Father   . Heart disease Father      SOCIAL HISTORY:  reports that she has quit smoking. She has never used smokeless tobacco. She reports that she does not drink alcohol or use drugs.   ALLERGIES: Gabapentin; Tetanus toxoids; Aspirin; Codeine; Dextromethorphan; Statins; Latex; Lidocaine; Morpholine salicylate; Naloxone hcl; Oxycodone-acetaminophen; Pentazocine; and Tape   MEDICATIONS:  Current Outpatient Medications  Medication Sig Dispense Refill  . calcium carbonate (TUMS - DOSED IN MG ELEMENTAL CALCIUM) 500 MG chewable tablet Chew 1 tablet by mouth 2 (two) times daily.    . DOCOSAHEXAENOIC ACID PO Take 2 g  by mouth daily.    Marland Kitchen FLUoxetine (PROZAC) 20 MG capsule Take 20 mg by mouth daily.    Marland Kitchen glipiZIDE (GLUCOTROL) 5 MG tablet Take 10 mg by mouth 2 (two) times daily.    . insulin glargine (LANTUS) 100 UNIT/ML injection Inject 20 Units into the skin at bedtime.    . insulin lispro (HUMALOG) 100 UNIT/ML injection Inject 42 Units into the skin 2 (two) times daily.     Marland Kitchen lisinopril (PRINIVIL,ZESTRIL) 10 MG tablet Take 5 mg by mouth daily.     . Omega-3  Fatty Acids (FISH OIL PO) Take 2,000 mg by mouth 2 (two) times daily.    . pantoprazole (PROTONIX) 40 MG tablet Take 40 mg by mouth at bedtime.    . Probiotic Product (PROBIOTIC-10 PO) Take 1 tablet by mouth daily.    . Rivaroxaban (XARELTO) 15 MG TABS tablet Take 1 tablet (15 mg total) by mouth daily. 90 tablet 2  . sotalol (BETAPACE) 80 MG tablet Take 0.5 tablets (40 mg total) by mouth 2 (two) times daily. 90 tablet 3  . ACCU-CHEK AVIVA PLUS test strip     . B-D ULTRAFINE III SHORT PEN 31G X 8 MM MISC     . diclofenac sodium (VOLTAREN) 1 % GEL   0  . fluconazole (DIFLUCAN) 150 MG tablet     . furosemide (LASIX) 20 MG tablet Take 20 mg by mouth daily.    Marland Kitchen levothyroxine (SYNTHROID, LEVOTHROID) 100 MCG tablet Take 1 tablet by mouth daily.     No current facility-administered medications for this encounter.      REVIEW OF SYSTEMS: On review of systems, the patient reports that she is doing well overall. She denies any chest pain, cough, fevers, chills, night sweats, unintended weight changes. She denies any bowel or bladder disturbances, and denies abdominal pain, nausea or vomiting. She denies any new musculoskeletal or joint aches or pains. A complete review of systems is obtained and is otherwise negative.   PHYSICAL EXAM:  Wt Readings from Last 3 Encounters:  01/19/18 212 lb (96.2 kg)  01/06/18 214 lb (97.1 kg)  12/14/17 207 lb 1.9 oz (93.9 kg)   Temp Readings from Last 3 Encounters:  01/19/18 98.1 F (36.7 C) (Oral)   BP Readings from Last 3 Encounters:  01/19/18 (!) 118/40  01/06/18 116/62  12/14/17 90/60   Pulse Readings from Last 3 Encounters:  01/19/18 (!) 45  01/06/18 (!) 59  12/14/17 (!) 40   Pain Assessment Pain Score: 0-No pain/10  In general this is a well appearing caucasian female in no acute distress. She is alert and oriented x4 and appropriate throughout the examination. HEENT reveals that the patient is normocephalic, atraumatic. EOMs are intact. PERRLA.  Skin is intact without any evidence of gross lesions. Cardiovascular exam reveals a regular rate and rhythm, no clicks rubs or murmurs are auscultated. Decreased breath sounds throughout. Lymphatic assessment is performed and does not reveal any adenopathy in the cervical, supraclavicular, axillary, or inguinal chains. Abdomen has active bowel sounds in all quadrants and is intact. The abdomen is soft, non tender, non distended. Lower extremities are negative for pretibial pitting edema, deep calf tenderness, cyanosis or clubbing.   ECOG = 1  0 - Asymptomatic (Fully active, able to carry on all predisease activities without restriction)  1 - Symptomatic but completely ambulatory (Restricted in physically strenuous activity but ambulatory and able to carry out work of a light or sedentary nature. For example, light housework, office  work)  2 - Symptomatic, <50% in bed during the day (Ambulatory and capable of all self care but unable to carry out any work activities. Up and about more than 50% of waking hours)  3 - Symptomatic, >50% in bed, but not bedbound (Capable of only limited self-care, confined to bed or chair 50% or more of waking hours)  4 - Bedbound (Completely disabled. Cannot carry on any self-care. Totally confined to bed or chair)  5 - Death   Eustace Pen MM, Creech RH, Tormey DC, et al. [phone]). "Toxicity and response criteria of the [facility]". [place] Oncol. 5 (6): 649-55    LABORATORY DATA:  No results found for: WBC, HGB, HCT, MCV, PLT Lab Results  Component Value Date   NA 144 12/01/2017   K 4.1 12/01/2017   CL 94 (L) 12/01/2017   CO2 42 (HH) 12/01/2017   No results found for: ALT, AST, GGT, ALKPHOS, BILITOT    RADIOGRAPHY: No results found.     IMPRESSION/PLAN: 1. Stage I, T1cNoMo, limited stage small cell lung cancer of the left upper lobe lung.  Dr. Lisbeth Renshaw and I discussed the pathology findings and workup thus far.   We discussed the  natural history of limited stage small cell carcinoma and general treatment, highlighting the role of radiotherapy in the management. We discussed the available radiation techniques, and focused on the details of logistics and delivery of stereotactic body radiotherapy (SBRT).  The recommendation is for a course of 3-5 SBRT treatments delivered over a period of 1 - 1.5 weeks.  We reviewed the anticipated acute and late sequelae associated with radiation in this setting. The patient was encouraged to ask questions that were answered to her satisfaction.  At the conclusion of our conversation, the patient elects to proceed with SBRT to the left upper lobe lung lesion as recommended.  She has freely signed written consent today in the office and a copy of this document has been placed in her medical record as well as a copy provided to the patient for her personal records.  CT simulation will be scheduled for Friday 01/22/18 in anticipation of treatment starting the week of 02/01/18. If radiation is tolerated well with good response, the patient will revisit the possibility for chemotherapy.  She is eager to begin treatment.  We enjoyed meeting her today and look forward to participating in her care.  The above documentation reflects my direct findings during this shared patient visit. Please see the separate note by Dr. Lisbeth Renshaw on this date for the remainder of the patient's plan of care.    Nicholos Johns, PA-C  ------------------------------------------------  Jodelle Gross, MD, PhD   This document serves as a record of services personally performed by Kyung Rudd, MD and Freeman Caldron, PA-C. It was created on their behalf by Linward Natal, a trained medical scribe. The creation of this record is based on the scribe's personal observations and the provider's statements to them. This document has been checked and approved by the attending provider.

## 2018-01-20 ENCOUNTER — Encounter: Payer: Self-pay | Admitting: General Practice

## 2018-01-20 DIAGNOSIS — C3412 Malignant neoplasm of upper lobe, left bronchus or lung: Secondary | ICD-10-CM | POA: Insufficient documentation

## 2018-01-20 NOTE — Progress Notes (Signed)
[place] Psychosocial Distress Screening Clinical Social Work  Clinical Social Work was referred by distress screening protocol.  The patient scored a 7 on the Psychosocial Distress Thermometer which indicates moderate distress. Clinical Social Worker attempted to contact patient by phone to assess for distress and other psychosocial needs.   Unable to contact, VM full, could not leave message.  Will await call back.    ONCBCN DISTRESS SCREENING 01/19/2018  Screening Type Initial Screening  Distress experienced in past week (1-10) 7  Practical problem type Housing;Transportation  Family Problem type Partner  Emotional problem type Nervousness/Anxiety;Depression;Adjusting to illness  Spiritual/Religous concerns type Facing my mortality;Loss of sense of purpose  Information Concerns Type Lack of info about diagnosis;Lack of info about treatment;Lack of info about complementary therapy choices  Physical Problem type Pain;Getting around;Bathing/dressing;Tingling hands/feet;Skin dry/itchy    Clinical Social Worker follow up needed: Yes.   await patient return call, could not contact, VM full.    If yes, follow up plan:  Beverely Pace, [place], LCSW Clinical Social Worker Phone:  [phone]

## 2018-01-21 LAB — PULMONARY FUNCTION TEST

## 2018-01-22 ENCOUNTER — Ambulatory Visit
Admission: RE | Admit: 2018-01-22 | Discharge: 2018-01-22 | Disposition: A | Discharge status: Home / Self Care | Payer: Medicare HMO | Source: Ambulatory Visit | Attending: Radiation Oncology | Admitting: Radiation Oncology

## 2018-01-22 ENCOUNTER — Encounter: Payer: Self-pay | Admitting: *Deleted

## 2018-01-22 DIAGNOSIS — C3412 Malignant neoplasm of upper lobe, left bronchus or lung: Secondary | ICD-10-CM

## 2018-01-22 DIAGNOSIS — Z510 Encounter for antineoplastic radiation therapy: Secondary | ICD-10-CM | POA: Diagnosis not present

## 2018-01-25 ENCOUNTER — Telehealth: Payer: Self-pay | Admitting: Radiation Oncology

## 2018-01-25 NOTE — Telephone Encounter (Signed)
Patient called I to request another copy of schedule.  Calendar/ Letter mailed to patient

## 2018-01-27 DIAGNOSIS — Z510 Encounter for antineoplastic radiation therapy: Secondary | ICD-10-CM | POA: Diagnosis not present

## 2018-02-02 ENCOUNTER — Ambulatory Visit
Admission: RE | Admit: 2018-02-02 | Discharge: 2018-02-02 | Disposition: A | Discharge status: Home / Self Care | Payer: Medicare HMO | Source: Ambulatory Visit | Attending: Radiation Oncology | Admitting: Radiation Oncology

## 2018-02-02 DIAGNOSIS — Z510 Encounter for antineoplastic radiation therapy: Secondary | ICD-10-CM | POA: Diagnosis not present

## 2018-02-05 ENCOUNTER — Ambulatory Visit
Admission: RE | Admit: 2018-02-05 | Discharge: 2018-02-05 | Disposition: A | Discharge status: Home / Self Care | Payer: Medicare HMO | Source: Ambulatory Visit | Attending: Radiation Oncology | Admitting: Radiation Oncology

## 2018-02-05 DIAGNOSIS — Z510 Encounter for antineoplastic radiation therapy: Secondary | ICD-10-CM | POA: Diagnosis not present

## 2018-02-09 ENCOUNTER — Ambulatory Visit
Admission: RE | Admit: 2018-02-09 | Discharge: 2018-02-09 | Disposition: A | Discharge status: Home / Self Care | Payer: Medicare HMO | Source: Ambulatory Visit | Attending: Radiation Oncology | Admitting: Radiation Oncology

## 2018-02-09 DIAGNOSIS — Z510 Encounter for antineoplastic radiation therapy: Secondary | ICD-10-CM | POA: Diagnosis not present

## 2018-02-11 ENCOUNTER — Ambulatory Visit
Admission: RE | Admit: 2018-02-11 | Discharge: 2018-02-11 | Disposition: A | Discharge status: Home / Self Care | Payer: Medicare HMO | Source: Ambulatory Visit | Attending: Radiation Oncology | Admitting: Radiation Oncology

## 2018-02-11 DIAGNOSIS — Z510 Encounter for antineoplastic radiation therapy: Secondary | ICD-10-CM | POA: Diagnosis not present

## 2018-02-15 ENCOUNTER — Ambulatory Visit
Admission: RE | Admit: 2018-02-15 | Discharge: 2018-02-15 | Disposition: A | Discharge status: Home / Self Care | Payer: Medicare HMO | Source: Ambulatory Visit | Attending: Radiation Oncology | Admitting: Radiation Oncology

## 2018-02-15 ENCOUNTER — Encounter: Payer: Self-pay | Admitting: Radiation Oncology

## 2018-02-15 DIAGNOSIS — C3412 Malignant neoplasm of upper lobe, left bronchus or lung: Secondary | ICD-10-CM | POA: Insufficient documentation

## 2018-02-15 DIAGNOSIS — Z510 Encounter for antineoplastic radiation therapy: Secondary | ICD-10-CM | POA: Insufficient documentation

## 2018-02-24 NOTE — Progress Notes (Signed)
  Radiation Oncology         [phone] ________________________________  Name: Misty Payne MRN: [id number]  Date: 02/15/2018  DOB: [birth year]  End of Treatment Note  Diagnosis:   72 y.o. female with Stage I, T1cN0M0, limited stage small cell lung cancer of the left upper lobe lung     Indication for treatment:  Curative       Radiation treatment dates:   02/02/2018, 02/05/2018, 02/08/2018, 02/11/2018, 02/15/2018  Site/dose:   The tumor in the LUL was treated with a course of stereotactic body radiation treatment. The patient received 60 Gy in 5 fractions at 12 Gy per fraction.  Narrative: The patient tolerated radiation treatment relatively well.   The patient did not have any signs of acute toxicity during treatment.  Plan: The patient has completed radiation treatment. The patient will return to radiation oncology clinic for routine followup in one month. I advised the patient to call or return sooner if they have any questions or concerns related to their recovery or treatment.   ------------------------------------------------  Jodelle Gross, MD, PhD  This document serves as a record of services personally performed by Kyung Rudd, MD. It was created on his behalf by Rae Lips, a trained medical scribe. The creation of this record is based on the scribe's personal observations and the provider's statements to them. This document has been checked and approved by the attending provider.

## 2018-03-10 NOTE — Addendum Note (Signed)
Encounter addended by: Kyung Rudd, MD on: 03/10/2018 11:30 AM  Actions taken: Sign clinical note

## 2018-03-10 NOTE — Progress Notes (Signed)
  Radiation Oncology         [phone] ________________________________  Name: Misty Payne MRN: [id number]  Date: 01/22/2018  DOB: 19-Nov-1945  RESPIRATORY MOTION MANAGEMENT SIMULATION  NARRATIVE:  In order to account for effect of respiratory motion on target structures and other organs in the planning and delivery of radiotherapy, this patient underwent respiratory motion management simulation.  To accomplish this, when the patient was brought to the CT simulation planning suite, 4D respiratoy motion management CT images were obtained.  The CT images were loaded into the planning software.  Then, using a variety of tools including Cine, MIP, and standard views, the target volume and planning target volumes (PTV) were delineated.  Avoidance structures were contoured.  Treatment planning then occurred.  Dose volume histograms were generated and reviewed for each of the requested structure.  The resulting plan was carefully reviewed and approved today.   ------------------------------------------------  Jodelle Gross, MD, PhD

## 2018-03-10 NOTE — Progress Notes (Signed)
[place] Radiation Oncology Simulation and Treatment Planning Note   Name:  Misty Payne MRN: [id number]   Date: 03/10/2018  DOB: Jul 11, 1946  Status:outpatient    DIAGNOSIS:    ICD-10-CM   1. Small cell lung cancer, left upper lobe (HCC) C34.12      CONSENT VERIFIED:yes   SET UP: Patient is setup supine   IMMOBILIZATION: The patient was immobilized using a customized Vac Loc bag/ blue bag and customized accuform device   NARRATIVE:The patient was brought to the [place].  Identity was confirmed.  All relevant records and images related to the planned course of therapy were reviewed.  Then, the patient was positioned in a stable reproducible clinical set-up for radiation therapy. Abdominal compression was applied by me.  4D CT images were obtained and reproducible breathing pattern was confirmed. Free breathing CT images were obtained.  Skin markings were placed.  The CT images were loaded into the planning software where the target and avoidance structures were contoured.  The radiation prescription was entered and confirmed.    TREATMENT PLANNING NOTE:  Treatment planning then occurred. I have requested : IMRT planning.This treatment technique is medically necessary due to the high-dose of radiation delivered to the target region which is in close proximity to adjacent critical normal structures.  3 dimensional simulation is performed and dose volume histogram of the gross tumor volume, planning tumor volume and criticial normal structures including the spinal cord and lungs were analyzed and requested.  Special treatment procedure was performed due to high dose per fraction.  The patient will be monitored for increased risk of toxicity.  Daily imaging using cone beam CT will be used for target localization.  I anticipate that the patient will receive 60 Gy in 5 fractions to target volume. Further adjustments will be made based on  the planning process is necessary.  ------------------------------------------------  Jodelle Gross, MD, PhD

## 2018-03-18 DIAGNOSIS — C3412 Malignant neoplasm of upper lobe, left bronchus or lung: Secondary | ICD-10-CM | POA: Diagnosis not present

## 2018-03-18 DIAGNOSIS — Z9981 Dependence on supplemental oxygen: Secondary | ICD-10-CM

## 2018-03-18 DIAGNOSIS — Z923 Personal history of irradiation: Secondary | ICD-10-CM | POA: Diagnosis not present

## 2018-03-29 DIAGNOSIS — E039 Hypothyroidism, unspecified: Secondary | ICD-10-CM

## 2018-03-29 DIAGNOSIS — J9601 Acute respiratory failure with hypoxia: Secondary | ICD-10-CM

## 2018-03-29 DIAGNOSIS — J449 Chronic obstructive pulmonary disease, unspecified: Secondary | ICD-10-CM

## 2018-03-29 DIAGNOSIS — E119 Type 2 diabetes mellitus without complications: Secondary | ICD-10-CM | POA: Diagnosis not present

## 2018-03-29 DIAGNOSIS — I5032 Chronic diastolic (congestive) heart failure: Secondary | ICD-10-CM

## 2018-03-29 DIAGNOSIS — J441 Chronic obstructive pulmonary disease with (acute) exacerbation: Secondary | ICD-10-CM | POA: Diagnosis not present

## 2018-03-29 DIAGNOSIS — C3490 Malignant neoplasm of unspecified part of unspecified bronchus or lung: Secondary | ICD-10-CM

## 2018-03-29 DIAGNOSIS — I4891 Unspecified atrial fibrillation: Secondary | ICD-10-CM | POA: Diagnosis not present

## 2018-03-29 DIAGNOSIS — I739 Peripheral vascular disease, unspecified: Secondary | ICD-10-CM

## 2018-03-30 DIAGNOSIS — E119 Type 2 diabetes mellitus without complications: Secondary | ICD-10-CM | POA: Diagnosis not present

## 2018-03-30 DIAGNOSIS — E039 Hypothyroidism, unspecified: Secondary | ICD-10-CM | POA: Diagnosis not present

## 2018-03-30 DIAGNOSIS — J441 Chronic obstructive pulmonary disease with (acute) exacerbation: Secondary | ICD-10-CM | POA: Diagnosis not present

## 2018-03-30 DIAGNOSIS — J9601 Acute respiratory failure with hypoxia: Secondary | ICD-10-CM | POA: Diagnosis not present

## 2018-03-31 DIAGNOSIS — J9601 Acute respiratory failure with hypoxia: Secondary | ICD-10-CM | POA: Diagnosis not present

## 2018-03-31 DIAGNOSIS — E039 Hypothyroidism, unspecified: Secondary | ICD-10-CM | POA: Diagnosis not present

## 2018-03-31 DIAGNOSIS — I4891 Unspecified atrial fibrillation: Secondary | ICD-10-CM

## 2018-03-31 DIAGNOSIS — E119 Type 2 diabetes mellitus without complications: Secondary | ICD-10-CM | POA: Diagnosis not present

## 2018-03-31 DIAGNOSIS — J449 Chronic obstructive pulmonary disease, unspecified: Secondary | ICD-10-CM

## 2018-03-31 DIAGNOSIS — J441 Chronic obstructive pulmonary disease with (acute) exacerbation: Secondary | ICD-10-CM | POA: Diagnosis not present

## 2018-04-01 DIAGNOSIS — J9601 Acute respiratory failure with hypoxia: Secondary | ICD-10-CM | POA: Diagnosis not present

## 2018-04-01 DIAGNOSIS — I4891 Unspecified atrial fibrillation: Secondary | ICD-10-CM | POA: Diagnosis not present

## 2018-04-01 DIAGNOSIS — J449 Chronic obstructive pulmonary disease, unspecified: Secondary | ICD-10-CM | POA: Diagnosis not present

## 2018-04-01 DIAGNOSIS — J441 Chronic obstructive pulmonary disease with (acute) exacerbation: Secondary | ICD-10-CM | POA: Diagnosis not present

## 2018-04-06 ENCOUNTER — Encounter: Payer: Self-pay | Admitting: Gastroenterology

## 2018-04-08 ENCOUNTER — Ambulatory Visit: Payer: Medicare HMO | Admitting: Cardiology

## 2018-04-14 ENCOUNTER — Ambulatory Visit: Payer: Medicare HMO | Admitting: Cardiology

## 2018-04-15 ENCOUNTER — Encounter: Payer: Self-pay | Admitting: Cardiology

## 2018-04-28 DIAGNOSIS — C3412 Malignant neoplasm of upper lobe, left bronchus or lung: Secondary | ICD-10-CM

## 2018-04-28 DIAGNOSIS — Z923 Personal history of irradiation: Secondary | ICD-10-CM

## 2018-04-28 DIAGNOSIS — D509 Iron deficiency anemia, unspecified: Secondary | ICD-10-CM

## 2018-05-04 ENCOUNTER — Other Ambulatory Visit: Payer: Self-pay

## 2018-05-04 MED ORDER — RIVAROXABAN 15 MG PO TABS: 15.0000 mg | ORAL_TABLET | Freq: Every day | ORAL | 0 refills | Status: DC

## 2018-05-05 ENCOUNTER — Encounter: Payer: Self-pay | Admitting: Gastroenterology

## 2018-05-05 ENCOUNTER — Other Ambulatory Visit: Payer: Self-pay

## 2018-05-05 ENCOUNTER — Telehealth: Payer: Self-pay | Admitting: Cardiology

## 2018-05-05 MED ORDER — RIVAROXABAN 15 MG PO TABS: 15.0000 mg | ORAL_TABLET | Freq: Every day | ORAL | 0 refills | Status: DC

## 2018-05-05 NOTE — Telephone Encounter (Signed)
Med refill has been sent.

## 2018-05-05 NOTE — Telephone Encounter (Signed)
°*  STAT* If patient is at the pharmacy, call can be transferred to refill team.   1. Which medications need to be refilled? (please list name of each medication and dose if known) Xarelto  2. Which pharmacy/location (including street and city if local pharmacy) is medication to be sent to?[facility] in [place]   3. Do they need a 30 day or 90 day supply? 30  Need 30 day called in til mail order meds arrive at home.

## 2018-05-06 ENCOUNTER — Ambulatory Visit (INDEPENDENT_AMBULATORY_CARE_PROVIDER_SITE_OTHER): Payer: Medicare HMO | Admitting: Gastroenterology

## 2018-05-06 ENCOUNTER — Encounter: Payer: Self-pay | Admitting: Gastroenterology

## 2018-05-06 VITALS — BP 112/64 | HR 70 | Ht 62.0 in | Wt 211.0 lb

## 2018-05-06 DIAGNOSIS — K219 Gastro-esophageal reflux disease without esophagitis: Secondary | ICD-10-CM | POA: Diagnosis not present

## 2018-05-06 DIAGNOSIS — K625 Hemorrhage of anus and rectum: Secondary | ICD-10-CM

## 2018-05-06 DIAGNOSIS — R1310 Dysphagia, unspecified: Secondary | ICD-10-CM | POA: Diagnosis not present

## 2018-05-06 DIAGNOSIS — K5909 Other constipation: Secondary | ICD-10-CM | POA: Diagnosis not present

## 2018-05-06 MED ORDER — LINACLOTIDE 72 MCG PO CAPS: 72.0000 ug | ORAL_CAPSULE | Freq: Every day | ORAL | 0 refills | Status: AC

## 2018-05-06 NOTE — Progress Notes (Signed)
Chief Complaint:   Referring Provider:  Aldona Bar, MD      ASSESSMENT AND PLAN;   #1. GERD with intermittent esophageal dysphagia. Plan: - Continue protonix 40mg  po qd. - Ba swallow with barium tablet. - If EGD is needed, then at [facility], off [address], after cardiology clearance from Dr. Raliegh Ip. She is very high risk for any endoscopic procedures.  #2.  Chronic constipation. CT abdomen/pelvis- RH, neg per pt (when she was worked up for lung Ca) #3. Rectal bleeding (likely due to hemorrhoids, better with Preparation H, on Xarelto for A. Fib) Plan: - Miralax 17g p.o. once a day.  If she is still constipated, she will increase MiraLAX to 17 g p.o. twice daily. - Linzess 72 mcg po qd (samples given) - Prep H suppostories 1 bid  - If EGD is needed, then colon at the same at [facility] off [address] (Cardio clearence with Dr Raliegh Ip before).  Patient does understand that she is at a very high risk for sedation/anesthesia.  We can consider anesthesia consultation prior if they have such a service.   HPI:    Misty Payne is a 72 y.o. female   With multiple medical problems With intermittent dysphagia mostly to solids Has had reflux for which she takes Protonix.  No heartburn. Had a EGD with esophageal dilatation done twice before Has history of chronic constipation and has to strain.  Sometimes she has to perform rectal massage as well.  She has to lean back to have a bowel movement. Intermittent rectal bleeding which is better on Preparation H. No significant nausea, vomiting, odynophagia, diarrhea or weight loss.  Has history of stage I small cell cancer of the left upper lung status post stereotactic body radiotherapy, followed by Dr. Bobby Rumpf. Past Medical History:  Diagnosis Date  . Cancer ([place])   . Depression   . Diabetes mellitus without complication ([place])   . GERD (gastroesophageal reflux disease)   . History of blood transfusion   . IBS (irritable bowel syndrome)   . Thyroid cancer  [facility])     Past Surgical History:  Procedure Laterality Date  . APPENDECTOMY    . BLADDER SUSPENSION    . CAROTID ENDARTERECTOMY    . CAROTID STENT Right   . ESOPHAGOGASTRODUODENOSCOPY  12/19/2008   Small hiatal hernia.   Marland Kitchen RENAL ARTERY STENT Left   . THYROIDECTOMY    . TONSILLECTOMY AND ADENOIDECTOMY    . TOTAL ABDOMINAL HYSTERECTOMY W/ BILATERAL SALPINGOOPHORECTOMY      Family History  Problem Relation Age of Onset  . Cancer Mother   . Heart disease Mother   . Cancer Father   . Heart disease Father     Social History   Tobacco Use  . Smoking status: Former Research scientist (life sciences)  . Smokeless tobacco: Never Used  Substance Use Topics  . Alcohol use: No  . Drug use: No    Current Outpatient Medications  Medication Sig Dispense Refill  . ACCU-CHEK AVIVA PLUS test strip     . B-D ULTRAFINE III SHORT PEN 31G X 8 MM MISC     . calcium carbonate (TUMS - DOSED IN MG ELEMENTAL CALCIUM) 500 MG chewable tablet Chew 1 tablet by mouth 2 (two) times daily.    . diclofenac sodium (VOLTAREN) 1 % GEL   0  . DOCOSAHEXAENOIC ACID PO Take 2 g by mouth daily.    . fluconazole (DIFLUCAN) 150 MG tablet     . FLUoxetine (PROZAC) 20 MG capsule Take  20 mg by mouth daily.    Marland Kitchen glipiZIDE (GLUCOTROL) 5 MG tablet Take 10 mg by mouth 2 (two) times daily.    . insulin glargine (LANTUS) 100 UNIT/ML injection Inject 20 Units into the skin at bedtime.    . insulin lispro (HUMALOG) 100 UNIT/ML injection Inject 42 Units into the skin 2 (two) times daily.     Marland Kitchen lisinopril (PRINIVIL,ZESTRIL) 10 MG tablet Take 5 mg by mouth daily.     . Omega-3 Fatty Acids (FISH OIL PO) Take 2,000 mg by mouth 2 (two) times daily.    . pantoprazole (PROTONIX) 40 MG tablet Take 40 mg by mouth at bedtime.    . Probiotic Product (PROBIOTIC-10 PO) Take 1 tablet by mouth daily.    . Rivaroxaban (XARELTO) 15 MG TABS tablet Take 1 tablet (15 mg total) by mouth daily. Appointment needed for additional refills 30 tablet 0  . sotalol (BETAPACE)  80 MG tablet Take 0.5 tablets (40 mg total) by mouth 2 (two) times daily. 90 tablet 3  . furosemide (LASIX) 20 MG tablet Take 20 mg by mouth daily.    Marland Kitchen levothyroxine (SYNTHROID, LEVOTHROID) 100 MCG tablet Take 1 tablet by mouth daily.     No current facility-administered medications for this visit.     Allergies  Allergen Reactions  . Gabapentin Other (See Comments)    Dizziness and rapid heart rate  . Tetanus Toxoids Swelling  . Aspirin Palpitations  . Codeine Palpitations  . Dextromethorphan Palpitations  . Statins     Muscle pain/weakness  . Latex Rash  . Lidocaine Palpitations  . Morpholine Salicylate Itching  . Naloxone Hcl Itching  . Oxycodone-Acetaminophen Itching  . Pentazocine Itching  . Tape Itching    Review of Systems:  Constitutional: Denies fever, chills, diaphoresis, appetite change and fatigue.  HEENT: Has sinusitis Respiratory: has SOB, DOE, cough, chest tightness,  and wheezing.   Cardiovascular: Denies chest pain, palpitations and leg swelling.  Genitourinary: Denies dysuria, urgency, frequency, hematuria, flank pain and difficulty urinating.  Musculoskeletal: has myalgias, back pain, joint swelling, arthralgias and gait problem.  Skin: No rash.  Neurological: Denies dizziness, seizures, syncope, weakness, light-headedness, numbness and headaches.  Hematological: Denies adenopathy. Easy bruising, personal or family bleeding history  Psychiatric/Behavioral: has anxiety or depression     Physical Exam:    BP 112/64   Pulse 70   Ht 5\' 2"  (1.575 m)   Wt 211 lb (95.7 kg)   BMI 38.59 kg/m  Filed Weights   05/06/18 1004  Weight: 211 lb (95.7 kg)   Constitutional:  Well-developed, has shortness of breath at rest, on home oxygen. Psychiatric: Normal mood and affect. Behavior is normal. HEENT: Pupils normal.  Conjunctivae are normal. No scleral icterus. Neck supple.  Cardiovascular: Normal rate, regular rhythm. No edema Pulmonary/chest: Bilateral  decreased breath sounds. Abdominal: Soft, nondistended. Nontender. Bowel sounds active throughout. There are no masses palpable. No hepatomegaly. Rectal:  defered Neurological: Alert and oriented to person place and time. Skin: Skin is warm and dry. No rashes noted.  Data Reviewed: I have personally reviewed following labs and imaging studies  CBC: No flowsheet data found.  CMP: CMP Latest Ref Rng & Units 12/01/2017  Glucose 65 - 99 mg/dL 221(H)  BUN 8 - 27 mg/dL 15  Creatinine 0.57 - 1.00 mg/dL 0.90  Sodium 134 - 144 mmol/L 144  Potassium 3.5 - 5.2 mmol/L 4.1  Chloride 96 - 106 mmol/L 94(L)  CO2 20 - 29 mmol/L 42(HH)  Calcium  8.7 - 10.3 mg/dL 9.2  No flowsheet data found.     Carmell Austria, MD 05/06/2018, 10:12 AM  Cc: Aldona Bar, MD

## 2018-05-06 NOTE — Patient Instructions (Signed)
If you are age 70 or older, your body mass index should be between 23-30. Your Body mass index is 38.59 kg/m. If this is out of the aforementioned range listed, please consider follow up with your Primary Care Provider.  If you are age 61 or younger, your body mass index should be between 19-25. Your Body mass index is 38.59 kg/m. If this is out of the aformentioned range listed, please consider follow up with your Primary Care Provider.    You have been scheduled for a Barium Esophogram at [facility] (1st floor of the hospital) on 05/21/18 at 10:30am. Please arrive 15 minutes prior to your appointment for registration. Make certain not to have anything to eat or drink 3 hours prior to your test. If you need to reschedule for any reason, please contact radiology at [phone] to do so. __________________________________________________________________ A barium swallow is an examination that concentrates on views of the esophagus. This tends to be a double contrast exam (barium and two liquids which, when combined, create a gas to distend the wall of the oesophagus) or single contrast (non-ionic iodine based). The study is usually tailored to your symptoms so a good history is essential. Attention is paid during the study to the form, structure and configuration of the esophagus, looking for functional disorders (such as aspiration, dysphagia, achalasia, motility and reflux) EXAMINATION You may be asked to change into a gown, depending on the type of swallow being performed. A radiologist and radiographer will perform the procedure. The radiologist will advise you of the type of contrast selected for your procedure and direct you during the exam. You will be asked to stand, sit or lie in several different positions and to hold a small amount of fluid in your mouth before being asked to swallow while the imaging is performed .In some instances you may be asked to swallow barium coated  marshmallows to assess the motility of a solid food bolus. The exam can be recorded as a digital or video fluoroscopy procedure. POST PROCEDURE It will take 1-2 days for the barium to pass through your system. To facilitate this, it is important, unless otherwise directed, to increase your fluids for the next 24-48hrs and to resume your normal diet.  This test typically takes about 30 minutes to perform. __________________________________________________________________________________   We have given you samples of the following medication to take: Linzess 72 mcg once daily.  Please purchase the following medications over the counter and take as directed: Preperation H Suppository twice daily x 10 days'  Thank you,  Dr. Jackquline Denmark

## 2018-05-07 ENCOUNTER — Other Ambulatory Visit: Payer: Self-pay

## 2018-05-07 MED ORDER — RIVAROXABAN 15 MG PO TABS: 15.0000 mg | ORAL_TABLET | Freq: Every day | ORAL | 0 refills | Status: DC

## 2018-05-12 ENCOUNTER — Telehealth: Payer: Self-pay | Admitting: Emergency Medicine

## 2018-05-12 NOTE — Telephone Encounter (Signed)
Called patient to verify dosage for xarelto refill. No answer and no option to leave voicemail. Will continue efforts.

## 2018-05-18 DIAGNOSIS — J441 Chronic obstructive pulmonary disease with (acute) exacerbation: Secondary | ICD-10-CM

## 2018-05-18 DIAGNOSIS — I4891 Unspecified atrial fibrillation: Secondary | ICD-10-CM | POA: Diagnosis not present

## 2018-05-19 DIAGNOSIS — E039 Hypothyroidism, unspecified: Secondary | ICD-10-CM

## 2018-05-19 DIAGNOSIS — J9601 Acute respiratory failure with hypoxia: Secondary | ICD-10-CM | POA: Diagnosis not present

## 2018-05-19 DIAGNOSIS — I5032 Chronic diastolic (congestive) heart failure: Secondary | ICD-10-CM

## 2018-05-19 DIAGNOSIS — J441 Chronic obstructive pulmonary disease with (acute) exacerbation: Secondary | ICD-10-CM | POA: Diagnosis not present

## 2018-05-19 DIAGNOSIS — I4891 Unspecified atrial fibrillation: Secondary | ICD-10-CM | POA: Diagnosis not present

## 2018-05-19 DIAGNOSIS — I160 Hypertensive urgency: Secondary | ICD-10-CM

## 2018-05-20 ENCOUNTER — Telehealth: Payer: Self-pay | Admitting: Gastroenterology

## 2018-05-20 DIAGNOSIS — I4891 Unspecified atrial fibrillation: Secondary | ICD-10-CM | POA: Diagnosis not present

## 2018-05-20 DIAGNOSIS — I160 Hypertensive urgency: Secondary | ICD-10-CM | POA: Diagnosis not present

## 2018-05-20 DIAGNOSIS — J9601 Acute respiratory failure with hypoxia: Secondary | ICD-10-CM | POA: Diagnosis not present

## 2018-05-20 DIAGNOSIS — J441 Chronic obstructive pulmonary disease with (acute) exacerbation: Secondary | ICD-10-CM | POA: Diagnosis not present

## 2018-05-20 NOTE — Telephone Encounter (Signed)
Appt cancelled

## 2018-05-20 NOTE — Telephone Encounter (Signed)
Patient calling to cancel barium swallow for tomorrow 10.4.19 due to her being in the hosp.

## 2018-05-21 ENCOUNTER — Ambulatory Visit (HOSPITAL_COMMUNITY): Payer: Medicare HMO

## 2018-05-27 NOTE — Telephone Encounter (Signed)
Called patient to confirm a prescription. Patient reports stopping her eliquis  2 weeks ago because it "put" her into atrial fibrillation. Patient wants to discuss this with Dr. Agustin Cree at her next office visit. Will inform Dr. Agustin Cree.

## 2018-05-31 ENCOUNTER — Encounter: Payer: Self-pay | Admitting: Cardiology

## 2018-05-31 ENCOUNTER — Ambulatory Visit (INDEPENDENT_AMBULATORY_CARE_PROVIDER_SITE_OTHER): Payer: Medicare HMO | Admitting: Cardiology

## 2018-05-31 VITALS — BP 110/64 | HR 53 | Ht 64.0 in | Wt 214.0 lb

## 2018-05-31 DIAGNOSIS — J9612 Chronic respiratory failure with hypercapnia: Secondary | ICD-10-CM

## 2018-05-31 DIAGNOSIS — I480 Paroxysmal atrial fibrillation: Secondary | ICD-10-CM

## 2018-05-31 DIAGNOSIS — I2510 Atherosclerotic heart disease of native coronary artery without angina pectoris: Secondary | ICD-10-CM | POA: Diagnosis not present

## 2018-05-31 DIAGNOSIS — C3412 Malignant neoplasm of upper lobe, left bronchus or lung: Secondary | ICD-10-CM

## 2018-05-31 DIAGNOSIS — C3490 Malignant neoplasm of unspecified part of unspecified bronchus or lung: Secondary | ICD-10-CM | POA: Insufficient documentation

## 2018-05-31 DIAGNOSIS — J9611 Chronic respiratory failure with hypoxia: Secondary | ICD-10-CM | POA: Diagnosis not present

## 2018-05-31 LAB — BASIC METABOLIC PANEL
BUN/Creatinine Ratio: 20 (ref 12–28)
BUN: 24 mg/dL (ref 8–27)
CHLORIDE: 94 mmol/L — AB (ref 96–106)
CO2: 37 mmol/L — AB (ref 20–29)
CREATININE: 1.19 mg/dL — AB (ref 0.57–1.00)
Calcium: 9.1 mg/dL (ref 8.7–10.3)
GFR calc Af Amer: 53 mL/min/{1.73_m2} — ABNORMAL LOW (ref >59)
GFR calc non Af Amer: 46 mL/min/{1.73_m2} — ABNORMAL LOW (ref >59)
GLUCOSE: 218 mg/dL — AB (ref 65–99)
Potassium: 4.6 mmol/L (ref 3.5–5.2)
Sodium: 142 mmol/L (ref 134–144)

## 2018-05-31 LAB — CBC
HEMOGLOBIN: 8.3 g/dL — AB (ref 11.1–15.9)
Hematocrit: 28.1 % — ABNORMAL LOW (ref 34.0–46.6)
MCH: 26.5 pg — ABNORMAL LOW (ref 26.6–33.0)
MCHC: 29.5 g/dL — ABNORMAL LOW (ref 31.5–35.7)
MCV: 90 fL (ref 79–97)
Platelets: 317 10*3/uL (ref 150–450)
RBC: 3.13 x10E6/uL — ABNORMAL LOW (ref 3.77–5.28)
RDW: 17 % — ABNORMAL HIGH (ref 12.3–15.4)
WBC: 8.4 10*3/uL (ref 3.4–10.8)

## 2018-05-31 NOTE — Progress Notes (Signed)
Cardiology Office Note:    Date:  05/31/2018   ID:  Kegan, Shepardson November 22, 1945, MRN [id number]  PCP:  Aldona Bar, MD  Cardiologist:  Jenne Campus, MD    Referring MD: Aldona Bar, MD   Chief Complaint  Patient presents with  . Hospitalization Follow-up  I was in the hospital  History of Present Illness:    Misty Payne is a 72 y.o. female with complex past medical history she is been a lifelong smoker recently quit after she was diagnosed with lung cancer.  She required radiation therapy.  Additional problems include paroxysmal atrial fibrillation, coronary artery disease.  She is a chronically hypoxemic and hypercapnic.  She looks very pale today she recently finished a course of radiation therapy she stopped Xarelto herself thinking Xarelto trigger atrial fibrillation on top of that she tells me that anytime she eats something her heart will go to atrial fibrillation.  Recently she was in the hospital she was noted to have episode of atrial fibrillation.  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  I explained to her rationale for pacemaker implantation however she still does not want to have it.  She also refused to take Xarelto.  She understands consequence of it.  Which include stroke.  Past Medical History:  Diagnosis Date  . Cancer ([place])   . Depression   . Diabetes mellitus without complication ([place])   . GERD (gastroesophageal reflux disease)   . History of blood transfusion   . IBS (irritable bowel syndrome)   . Thyroid cancer [facility])     Past Surgical History:  Procedure Laterality Date  . APPENDECTOMY    . BLADDER SUSPENSION    . CAROTID ENDARTERECTOMY    . CAROTID STENT Right     . ESOPHAGOGASTRODUODENOSCOPY  12/19/2008   Small hiatal hernia.   Marland Kitchen RENAL ARTERY STENT Left   . THYROIDECTOMY    . TONSILLECTOMY AND ADENOIDECTOMY    . TOTAL ABDOMINAL HYSTERECTOMY W/ BILATERAL SALPINGOOPHORECTOMY      Current Medications: Current Meds  Medication Sig  . ACCU-CHEK AVIVA PLUS test strip   . B-D ULTRAFINE III SHORT PEN 31G X 8 MM MISC   . calcium carbonate (TUMS - DOSED IN MG ELEMENTAL CALCIUM) 500 MG chewable tablet Chew 1 tablet by mouth 2 (two) times daily.  . diclofenac sodium (VOLTAREN) 1 % GEL   . DOCOSAHEXAENOIC ACID PO Take 2 g by mouth daily.  Marland Kitchen doxycycline (VIBRAMYCIN) 100 MG capsule Take 100 mg by mouth 2 (two) times daily.  . fluconazole (DIFLUCAN) 150 MG tablet   . FLUoxetine (PROZAC) 20 MG capsule Take 20 mg by mouth daily.  . furosemide (LASIX) 20 MG tablet Take 20 mg by mouth daily.  Marland Kitchen glipiZIDE (GLUCOTROL) 5 MG tablet Take 10 mg by mouth 2 (two) times daily.  . insulin glargine (LANTUS) 100 UNIT/ML injection Inject 20 Units into the skin at bedtime.  . insulin lispro (HUMALOG) 100 UNIT/ML injection Inject 42 Units into the skin 2 (two) times daily.   Marland Kitchen levothyroxine (SYNTHROID, LEVOTHROID) 100 MCG tablet Take 1 tablet by mouth daily.  Marland Kitchen linaclotide (LINZESS) 72 MCG capsule Take 1 capsule (72 mcg total) by mouth daily before breakfast.  . lisinopril (PRINIVIL,ZESTRIL)  10 MG tablet Take 5 mg by mouth daily.   . Omega-3 Fatty Acids (FISH OIL PO) Take 2,000 mg by mouth 2 (two) times daily.  . pantoprazole (PROTONIX) 40 MG tablet Take 40 mg by mouth at bedtime.  . Probiotic Product (PROBIOTIC-10 PO) Take 1 tablet by mouth daily.  . sotalol (BETAPACE) 80 MG tablet Take 0.5 tablets (40 mg total) by mouth 2 (two) times daily.     Allergies:   Gabapentin; Tetanus toxoids; Aspirin; Codeine; Dextromethorphan; Amoxicillin; Statins; Xarelto [rivaroxaban]; Latex; Lidocaine; Morpholine salicylate; Naloxone hcl; Oxycodone-acetaminophen; Pentazocine; and Tape    Social History   Socioeconomic History  . Marital status: Legally Separated    Spouse name: Not on file  . Number of children: Not on file  . Years of education: Not on file  . Highest education level: Not on file  Occupational History  . Not on file  Social Needs  . Financial resource strain: Not on file  . Food insecurity:    Worry: Not on file    Inability: Not on file  . Transportation needs:    Medical: Not on file    Non-medical: Not on file  Tobacco Use  . Smoking status: Former Research scientist (life sciences)  . Smokeless tobacco: Never Used  Substance and Sexual Activity  . Alcohol use: No  . Drug use: No  . Sexual activity: Not on file  Lifestyle  . Physical activity:    Days per week: Not on file    Minutes per session: Not on file  . Stress: Not on file  Relationships  . Social connections:    Talks on phone: Not on file    Gets together: Not on file    Attends religious service: Not on file    Active member of club or organization: Not on file    Attends meetings of clubs or organizations: Not on file    Relationship status: Not on file  Other Topics Concern  . Not on file  Social History Narrative  . Not on file     Family History: The patient's family history includes Cancer in her father and mother; Heart disease in her father and mother. ROS:   Please see the history of present illness.    All 14 point review of systems negative except as described per history of present illness  EKGs/Labs/Other Studies Reviewed:      Recent Labs: 12/01/2017: BUN 15; Creatinine, Ser 0.90; NT-Pro BNP 1,103; Potassium 4.1; Sodium 144  Recent Lipid Panel    Component Value Date/Time   CHOL 217 (H) 06/30/2017 1150   TRIG 300 (H) 06/30/2017 1150   HDL 35 (L) 06/30/2017 1150   CHOLHDL 6.2 (H) 06/30/2017 1150   LDLCALC 122 (H) 06/30/2017 1150    Physical Exam:    VS:  BP 110/64   Pulse (!) 53   Ht 5\' 4"  (1.626 m)   Wt 214 lb (97.1 kg) Comment: Reported, Wheelchair  SpO2 90%  Comment: 2lt of O2  BMI 36.73 kg/m     Wt Readings from Last 3 Encounters:  05/31/18 214 lb (97.1 kg)  05/06/18 211 lb (95.7 kg)  01/19/18 212 lb (96.2 kg)     GEN:  Well nourished, well developed in no acute distress HEENT: Normal NECK: No JVD; No carotid bruits LYMPHATICS: No lymphadenopathy CARDIAC: RRR, no murmurs, no rubs, no gallops RESPIRATORY:  Clear to auscultation without rales, wheezing or rhonchi  ABDOMEN: Soft, non-tender, non-distended MUSCULOSKELETAL:  No edema; No deformity  SKIN: Warm and dry LOWER EXTREMITIES: no swelling NEUROLOGIC:  Alert and oriented x 3 PSYCHIATRIC:  Normal affect   ASSESSMENT:    1. Paroxysmal atrial fibrillation (HCC)   2. Coronary artery disease involving native coronary artery of native heart without angina pectoris   3. Chronic respiratory failure with hypoxia and hypercapnia (HCC)   4. Malignant neoplasm of upper lobe of left lung ([place])    PLAN:    In order of problems listed above:  1. Paroxysmal atrial fibrillation her heart rate appears to be regular today we will do EKG to confirm the rhythm she will wear 48 hours monitor to see how much atrial fibrillation she has.  She is anticoagulated but stop Xarelto taking Xarelto trigger her atrial fibrillation.  I told her that she need to go back on it she does not want to.  I will check a Chem-7 as well as CBC and then decide about different shortness of anticoagulants if possible.   2. Tachycardia bradycardia syndrome: Discussion reinitiated regarding pacemaker implantation she is still refusing. 3. COPD: Likely she quit smoking. 4. Lung cancer followed by oncology team treated with radiation therapy no surgery no chemotherapy. 5. Chronic anemia most likely to radiation therapy as well as comorbidities will check Chem-7 today.  She required blood transfusion previously.   Medication Adjustments/Labs and Tests Ordered: Current medicines are reviewed at length with the patient today.   Concerns regarding medicines are outlined above.  No orders of the defined types were placed in this encounter.  Medication changes: No orders of the defined types were placed in this encounter.   Signed, Park Liter, MD, [facility] 05/31/2018 9:14 AM    [place]

## 2018-05-31 NOTE — Patient Instructions (Signed)
Medication Instructions:  Your physician recommends that you continue on your current medications as directed. Please refer to the Current Medication list given to you today.  If you need a refill on your cardiac medications before your next appointment, please call your pharmacy.   Lab work: Your physician recommends that you return for lab work today: BMP, CBC  If you have labs (blood work) drawn today and your tests are completely normal, you will receive your results only by: Marland Kitchen MyChart Message (if you have MyChart) OR . A paper copy in the mail If you have any lab test that is abnormal or we need to change your treatment, we will call you to review the results.  Testing/Procedures: Your physician has recommended that you wear a holter monitor. Holter monitors are medical devices that record the heart's electrical activity. Doctors most often use these monitors to diagnose arrhythmias. Arrhythmias are problems with the speed or rhythm of the heartbeat. The monitor is a small, portable device. You can wear one while you do your normal daily activities. This is usually used to diagnose what is causing palpitations/syncope (passing out). Wear for 48 hours     Follow-Up: At [facility], you and your health needs are our priority.  As part of our continuing mission to provide you with exceptional heart care, we have created designated Provider Care Teams.  These Care Teams include your primary Cardiologist (physician) and Advanced Practice Providers (APPs -  Physician Assistants and Nurse Practitioners) who all work together to provide you with the care you need, when you need it. You will need a follow up appointment in 3 months.  Please call our office 2 months in advance to schedule this appointment.  You may see Jenne Campus, MD or another member of our [place] Provider Team in [place]: Shirlee More, MD . Jyl Heinz, MD  Any Other Special Instructions Will Be Listed Below  (If Applicable).   Holter Monitoring A Holter monitor is a small device that is used to detect abnormal heart rhythms. It clips to your clothing and is connected by wires to flat, sticky disks (electrodes) that attach to your chest. It is worn continuously for 24-48 hours. Follow these instructions at home:  Wear your Holter monitor at all times, even while exercising and sleeping, for as long as directed by your health care provider.  Make sure that the Holter monitor is safely clipped to your clothing or close to your body as recommended by your health care provider.  Do not get the monitor or wires wet.  Do not put body lotion or moisturizer on your chest.  Keep your skin clean.  Keep a diary of your daily activities, such as walking and doing chores. If you feel that your heartbeat is abnormal or that your heart is fluttering or skipping a beat: ? Record what you are doing when it happens. ? Record what time of day the symptoms occur.  Return your Holter monitor as directed by your health care provider.  Keep all follow-up visits as directed by your health care provider. This is important. Get help right away if:  You feel lightheaded or you faint.  You have trouble breathing.  You feel pain in your chest, upper arm, or jaw.  You feel sick to your stomach and your skin is pale, cool, or damp.  You heartbeat feels unusual or abnormal. This information is not intended to replace advice given to you by your health care provider. Make  sure you discuss any questions you have with your health care provider. Document Released: 05/02/2004 Document Revised: 01/10/2016 Document Reviewed: 03/13/2014 Elsevier Interactive Patient Education  [employer].

## 2018-06-08 DIAGNOSIS — Z7901 Long term (current) use of anticoagulants: Secondary | ICD-10-CM

## 2018-06-08 DIAGNOSIS — Z923 Personal history of irradiation: Secondary | ICD-10-CM

## 2018-06-08 DIAGNOSIS — N6459 Other signs and symptoms in breast: Secondary | ICD-10-CM

## 2018-06-08 DIAGNOSIS — C3412 Malignant neoplasm of upper lobe, left bronchus or lung: Secondary | ICD-10-CM

## 2018-06-18 ENCOUNTER — Ambulatory Visit: Payer: Medicare HMO | Admitting: Cardiology

## 2018-06-28 ENCOUNTER — Ambulatory Visit: Payer: Medicare HMO | Admitting: Cardiology

## 2018-07-02 ENCOUNTER — Ambulatory Visit: Payer: Medicare HMO

## 2018-07-18 DEATH — deceased
